# Patient Record
Sex: Male | Born: 1955
Health system: Southern US, Community
[De-identification: ages and names within clinical notes are randomized; demographics above are authoritative.]

## PROBLEM LIST (undated history)

## (undated) DIAGNOSIS — R0602 Shortness of breath: Secondary | ICD-10-CM

## (undated) DIAGNOSIS — D649 Anemia, unspecified: Secondary | ICD-10-CM

## (undated) DIAGNOSIS — K219 Gastro-esophageal reflux disease without esophagitis: Secondary | ICD-10-CM

## (undated) DIAGNOSIS — I10 Essential (primary) hypertension: Secondary | ICD-10-CM

## (undated) DIAGNOSIS — N189 Chronic kidney disease, unspecified: Secondary | ICD-10-CM

## (undated) HISTORY — PX: HEMORRHOID SURGERY: SHX153

## (undated) HISTORY — PX: APPENDECTOMY: SHX54

---

## 2000-07-10 ENCOUNTER — Ambulatory Visit (HOSPITAL_COMMUNITY): Admission: RE | Admit: 2000-07-10 | Discharge: 2000-07-10 | Payer: Self-pay | Admitting: Pulmonary Disease

## 2003-02-16 ENCOUNTER — Ambulatory Visit (HOSPITAL_COMMUNITY): Admission: RE | Admit: 2003-02-16 | Discharge: 2003-02-16 | Payer: Self-pay | Admitting: Internal Medicine

## 2003-02-16 IMAGING — CT CT ABDOMEN W/ CM
1 of 3 series · 13 of 32 positions shown, 18 images · IV contrast (agent unspecified)
Comparison: none

**THIS REPORT HAS BEEN UPDATED TO INCLUDE ALL ASSOCIATED EXAMS**
CLINICAL DATA: Left lower quadrant abdominal pain, low back pain and constipation.  
Comparisons:  None.
CT ABDOMEN WITH CONTRAST
Contiguous 5 mm axial images were obtained through the abdomen and pelvis after oral and 100 cc non-ionic intravenous contrast.  
Subcentimeter well defined low density lesion is identified in the central liver but the liver and spleen are otherwise unremarkable.  Stomach, duodenum, pancreas, gallbladder, and abdominal bowel loops are unremarkable.  There is stool scattered along the course of the colon, but it is not distended.  The left adrenal gland is diffusely thickened.  2.2 cm mass is identified in the right adrenal gland which measures 47 Hounsfield units on the portal venous phase study and 16 Hounsfield units on the renal delay images.  There are subcentimeter well defined low density lesions within the parenchyma of each kidney.  There is no free fluid or adenopathy.  No abdominal aortic aneurysm.  
The proximal jejunum, beginning at the level of the ligament of Treitz is mildly dilated, but there is no substantial wall thickening in this region.  There is no peri-enteric inflammation at the level of the proximal jejunum.  
IMPRESSION
1.  Tiny low density lesions seen in the liver and kidneys are too small to characterize but statistically are most likely cysts.
2.  2.7 cm right adrenal lesions washes out by greater than 50% on delayed images, consistent with benign adrenal adenoma.
CT PELVIS WITH CONTRAST
There is no free fluid or lymphadenopathy.  Prostate gland is mildly prominent.  The bladder is distended but unremarkable.  Stool is noted in the sigmoid colon, but there is no associated pericolonic inflammation.  An area of apparent wall thickening is seen in the ascending colon, but this is at the level of a fold between the more horizontally oriented cecum and the vertically oriented ascending colon.
No definable etiology to explain this patient?s history of left lower quadrant pain.

[Series 4367: — · axial · 0.67mm/px · z∈[+1358,+1758]mm · 13 of 92 slices shown, 18 images]
[im 6/92  soft-tissue]
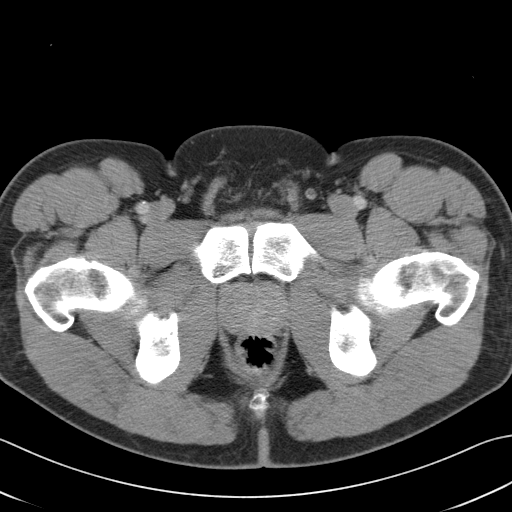
[im 6/92  bone]
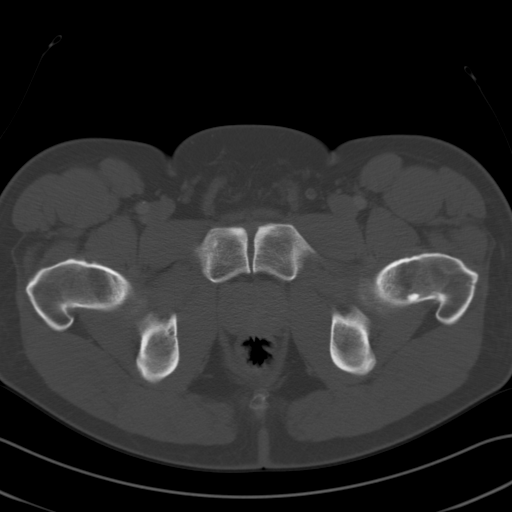
[im 12/92  soft-tissue]
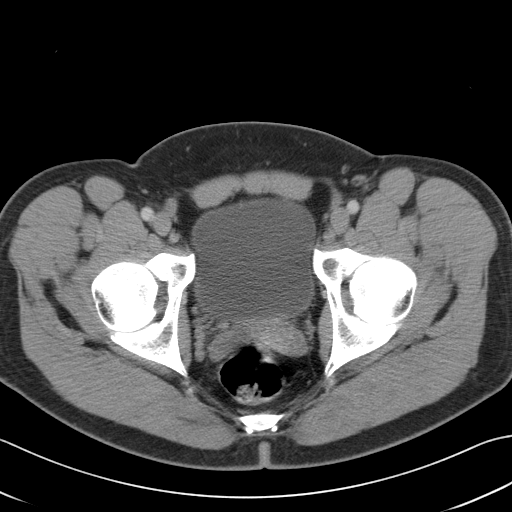
[im 23/92  soft-tissue]
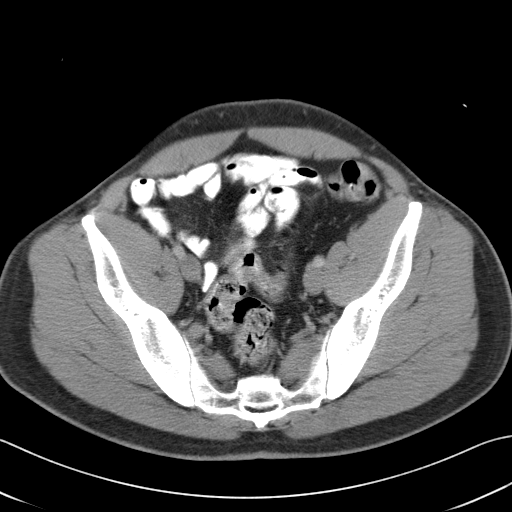
[im 29/92  soft-tissue]
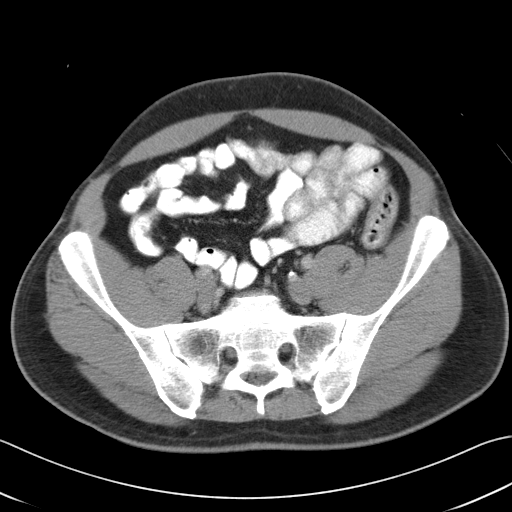
[im 35/92  soft-tissue]
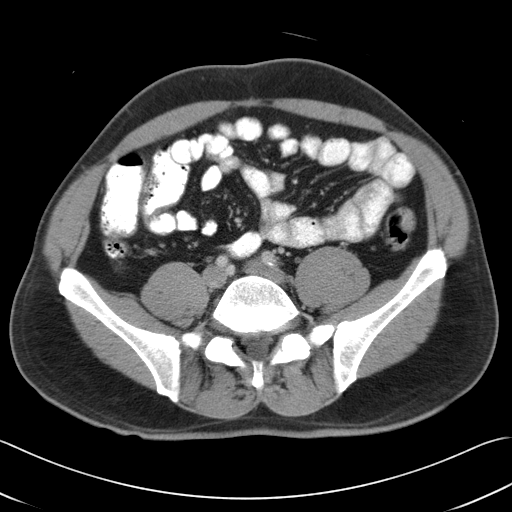
[im 40/92  soft-tissue]
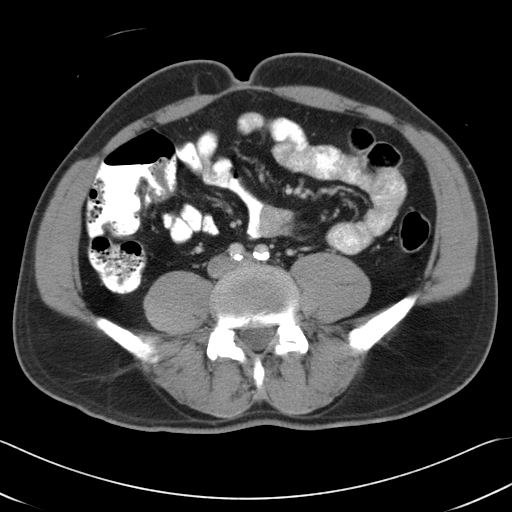
[im 52/92  soft-tissue]
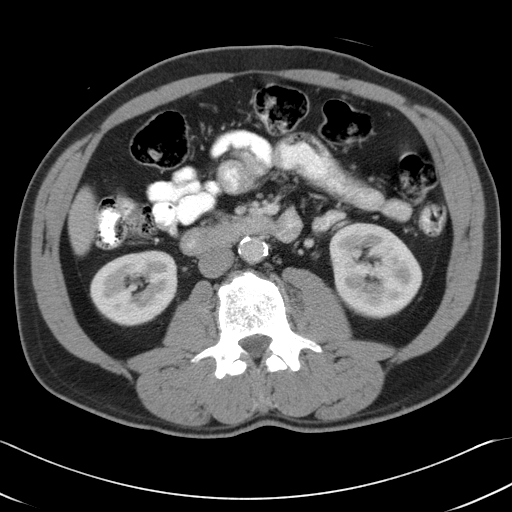
[im 57/92  soft-tissue]
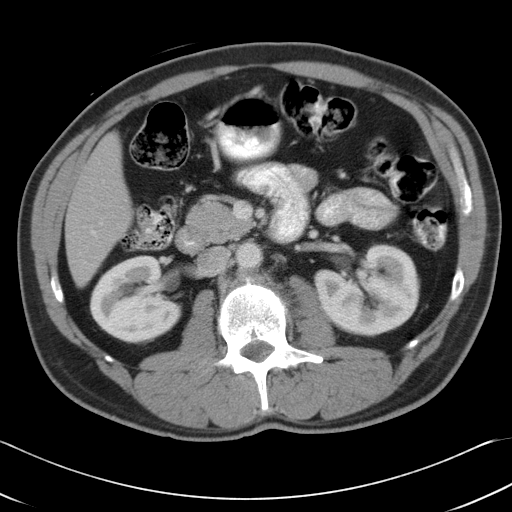
[im 63/92  soft-tissue]
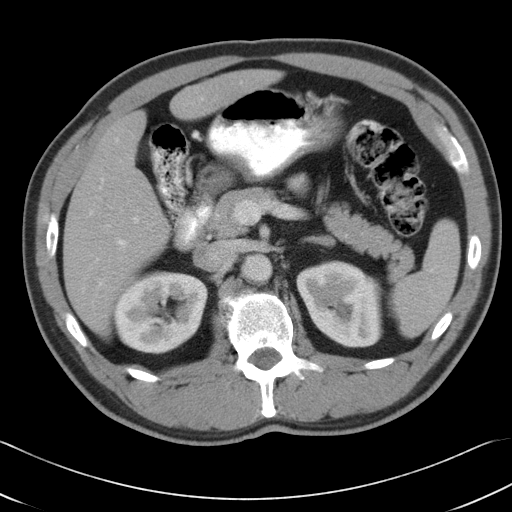
[im 63/92  bone]
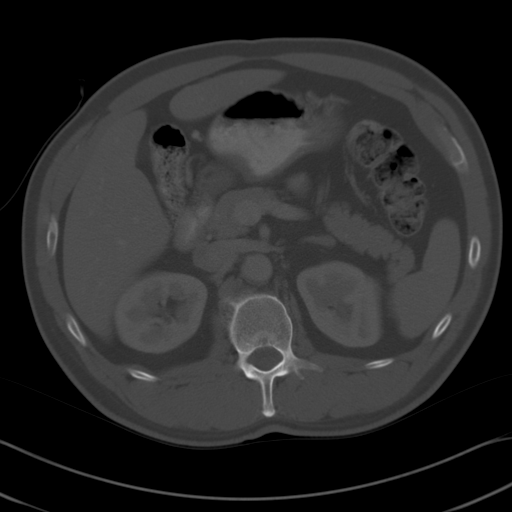
[im 69/92  soft-tissue]
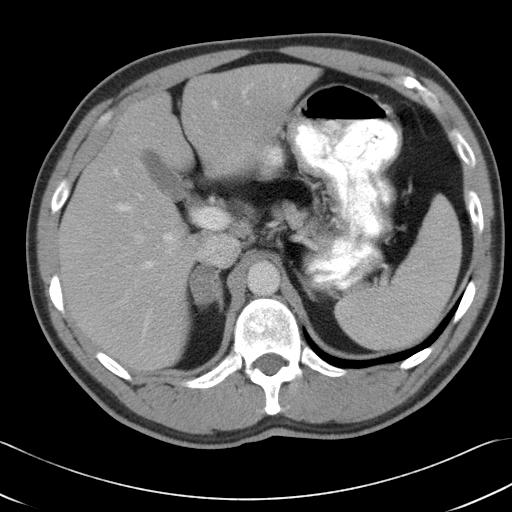
[im 69/92  lung]
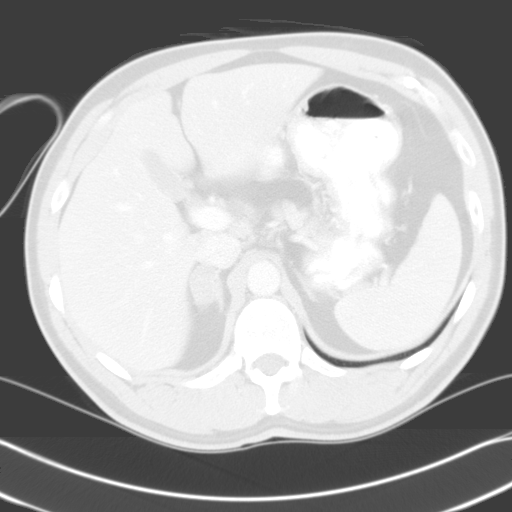
[im 74/92  lung]
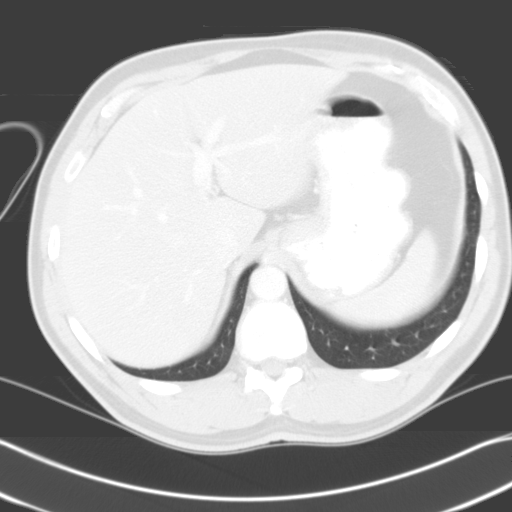
[im 80/92  soft-tissue]
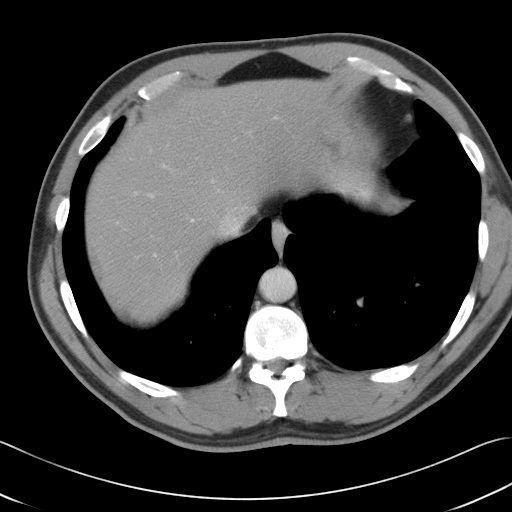
[im 80/92  lung]
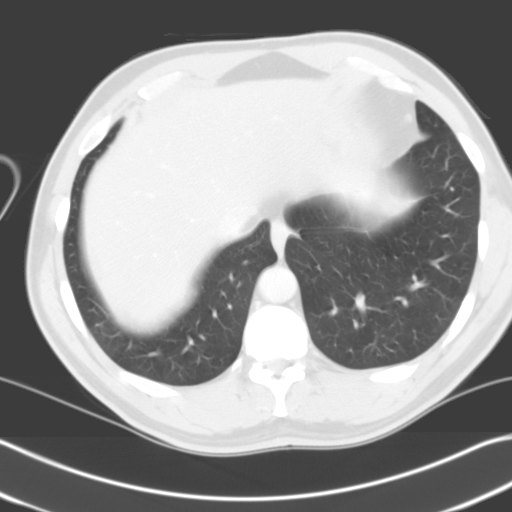
[im 86/92  soft-tissue]
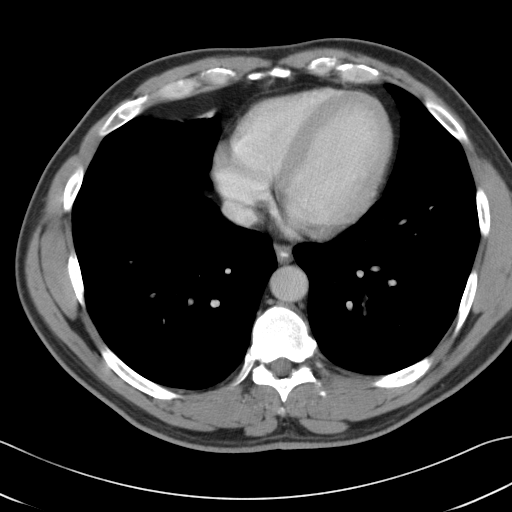
[im 86/92  lung]
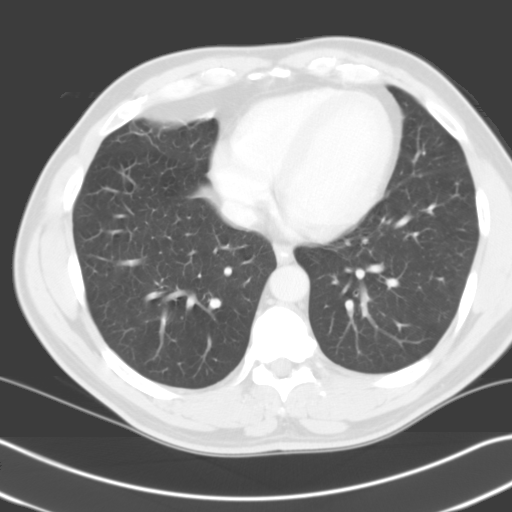

[13 of 32 positions shown; findings below may reference images not displayed]

## 2003-05-10 ENCOUNTER — Ambulatory Visit (HOSPITAL_COMMUNITY): Admission: RE | Admit: 2003-05-10 | Discharge: 2003-05-10 | Payer: Self-pay | Admitting: Internal Medicine

## 2004-03-16 ENCOUNTER — Ambulatory Visit: Payer: Self-pay | Admitting: Orthopedic Surgery

## 2004-03-17 ENCOUNTER — Ambulatory Visit: Payer: Self-pay | Admitting: Orthopedic Surgery

## 2004-03-17 ENCOUNTER — Ambulatory Visit (HOSPITAL_COMMUNITY): Admission: RE | Admit: 2004-03-17 | Discharge: 2004-03-17 | Payer: Self-pay | Admitting: Orthopedic Surgery

## 2004-03-20 ENCOUNTER — Ambulatory Visit: Payer: Self-pay | Admitting: Orthopedic Surgery

## 2004-03-27 ENCOUNTER — Ambulatory Visit: Payer: Self-pay | Admitting: Orthopedic Surgery

## 2005-11-29 ENCOUNTER — Emergency Department (HOSPITAL_COMMUNITY): Admission: EM | Admit: 2005-11-29 | Discharge: 2005-11-30 | Payer: Self-pay | Admitting: Emergency Medicine

## 2005-11-29 IMAGING — CR DG CHEST 1V PORT
1 series · 1 of 1 positions shown · non-contrast
Comparison: None available.

CLINICAL DATA: 49-year-old with chest pain.  
 PORTABLE CHEST - 1 VIEW [DATE]:

[view not recorded]
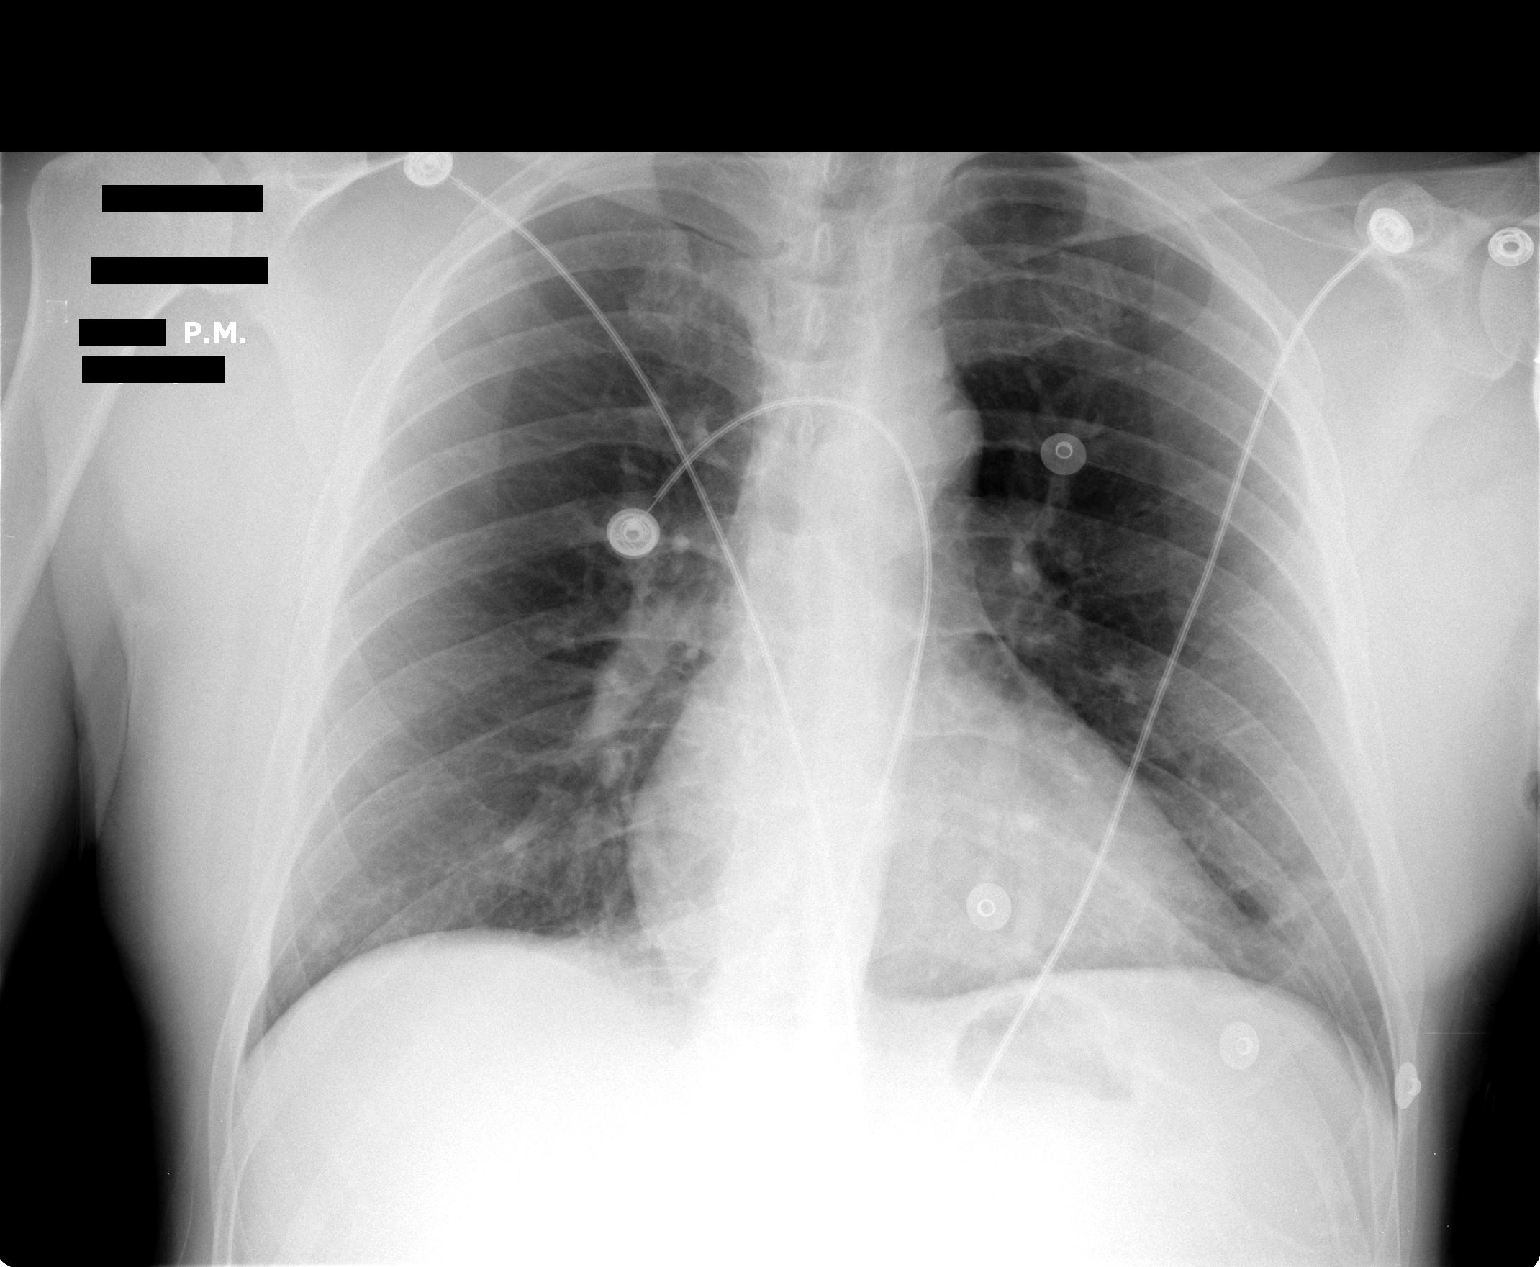

[1 of 1 positions shown; findings below may reference images not displayed]

FINDINGS: Cardiac silhouette, mediastinal and hilar contours are within normal limits.  Slightly low lung volumes with vascular crowding and bibasilar atelectasis.  Rounded densities overlying both lower lung zones are likely nipple shadows.  No edema or effusions.
IMPRESSION: 1.  Low volume chest film with vascular crowding and basilar atelectasis left slightly greater than right.  No edema or effusions.  
 2.  Probable nipple shadows bilaterally.

## 2008-12-06 ENCOUNTER — Ambulatory Visit (HOSPITAL_COMMUNITY): Admission: RE | Admit: 2008-12-06 | Discharge: 2008-12-06 | Payer: Self-pay | Admitting: Family Medicine

## 2008-12-06 IMAGING — CR DG LUMBAR SPINE COMPLETE 4+V
5 series · 5 of 5 positions shown · non-contrast
Comparison: None.

CLINICAL DATA: Low back pain with left leg pain.

LUMBAR SPINE - COMPLETE 4+ VIEW

[view not recorded (1 of 5)]
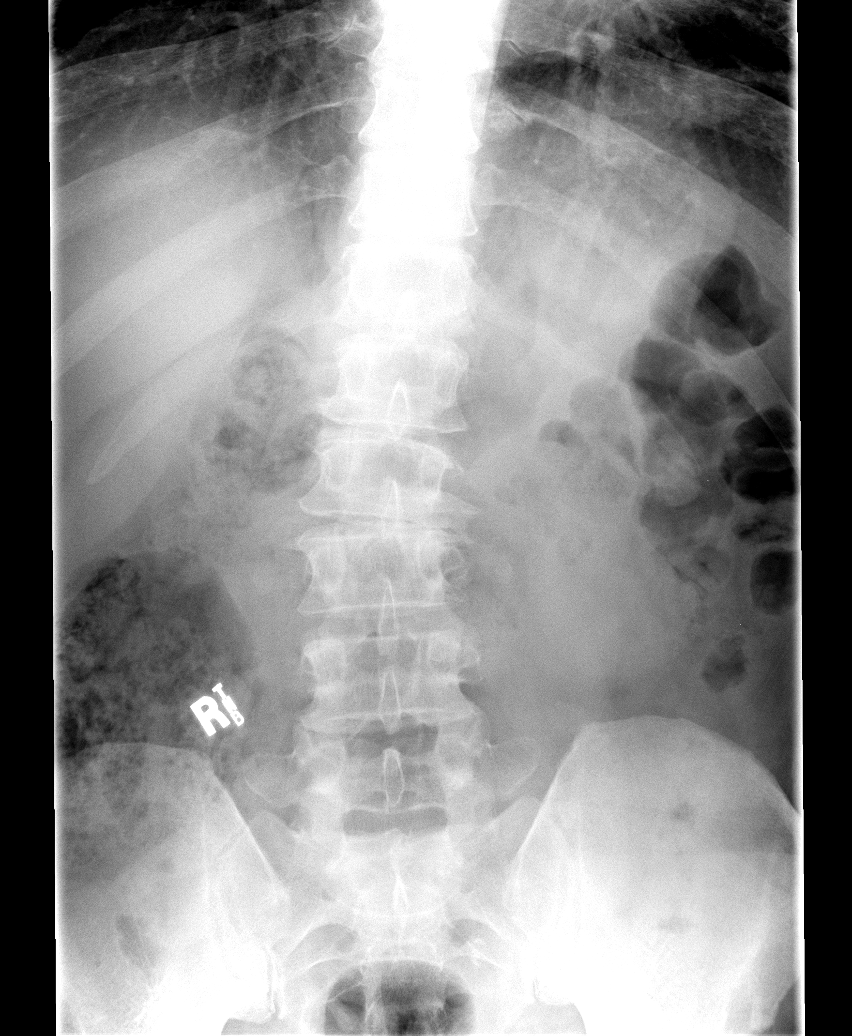

[view not recorded (2 of 5)]
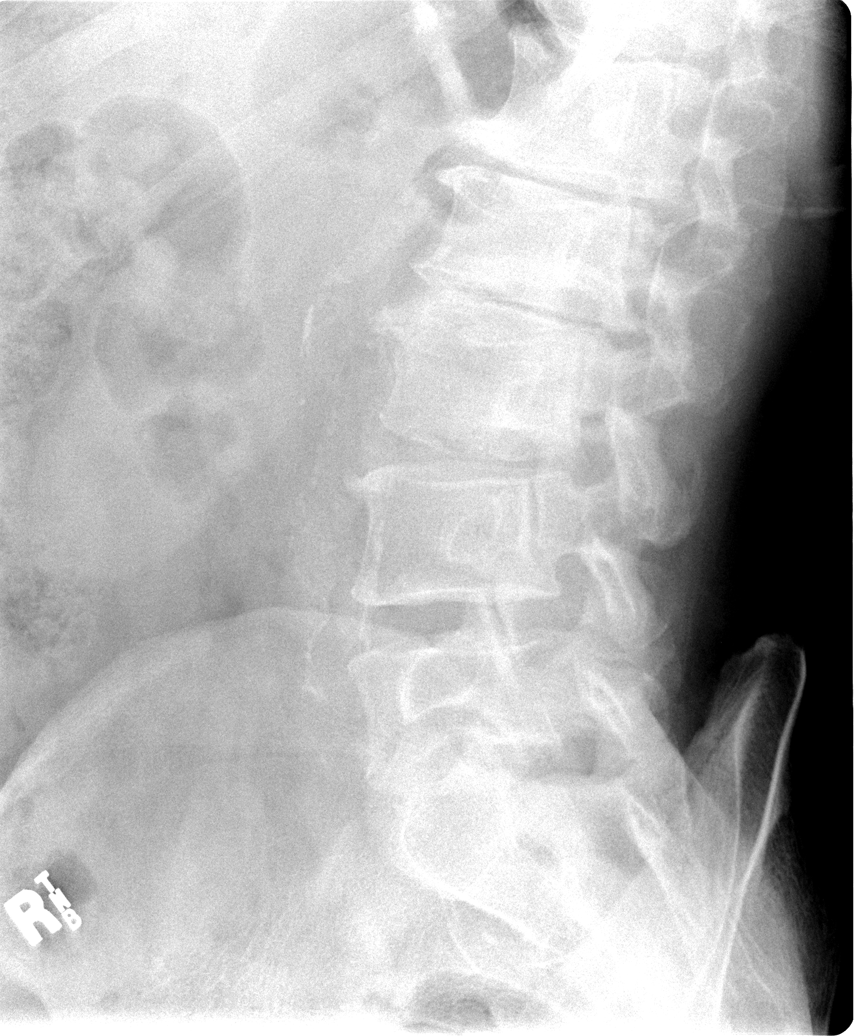

[view not recorded (3 of 5)]
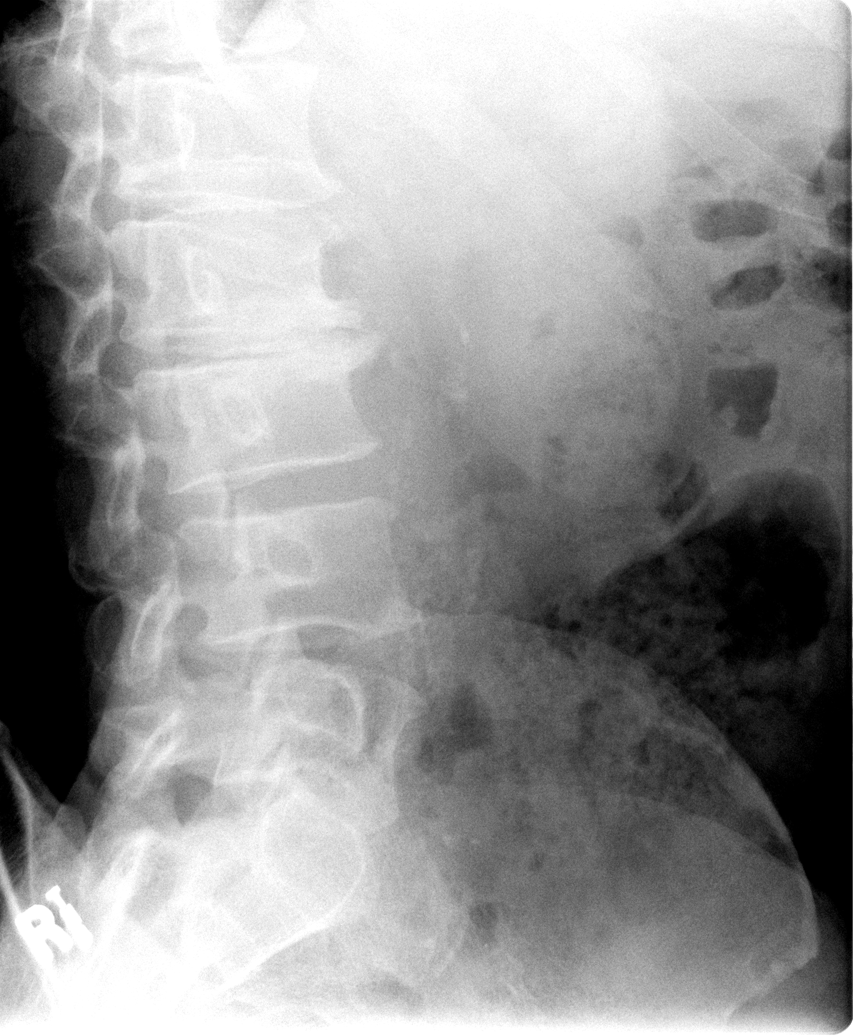

[view not recorded (4 of 5)]
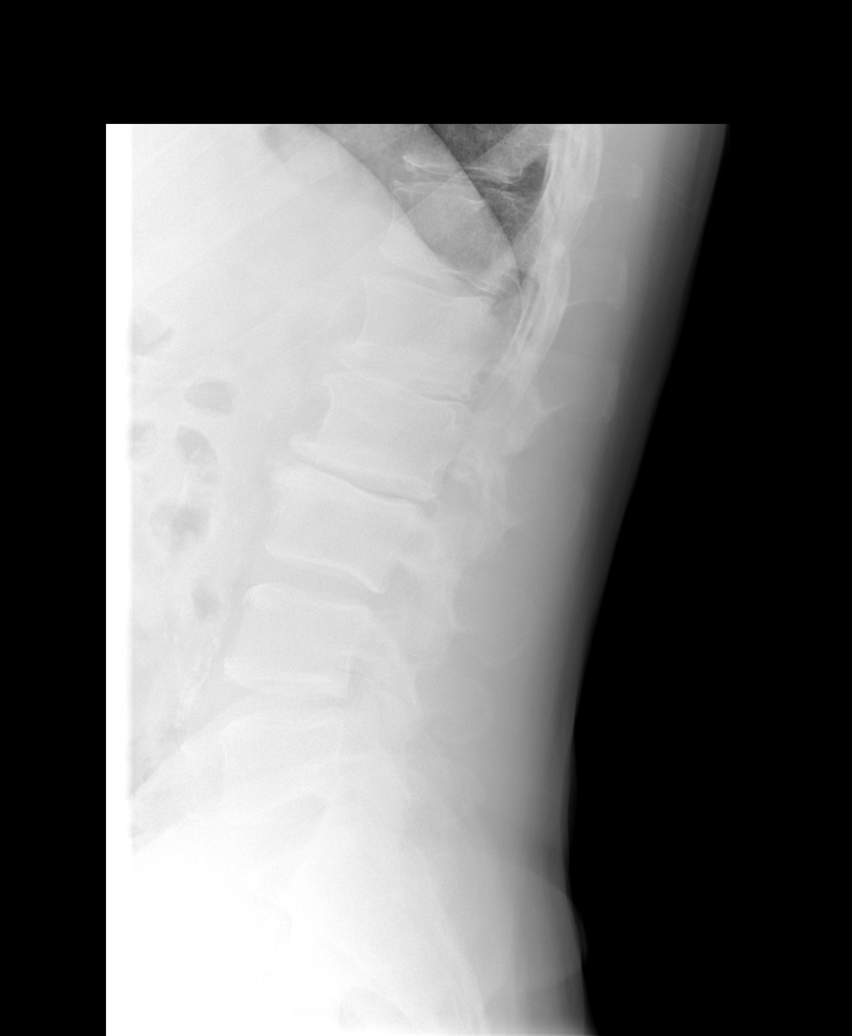

[view not recorded (5 of 5)]
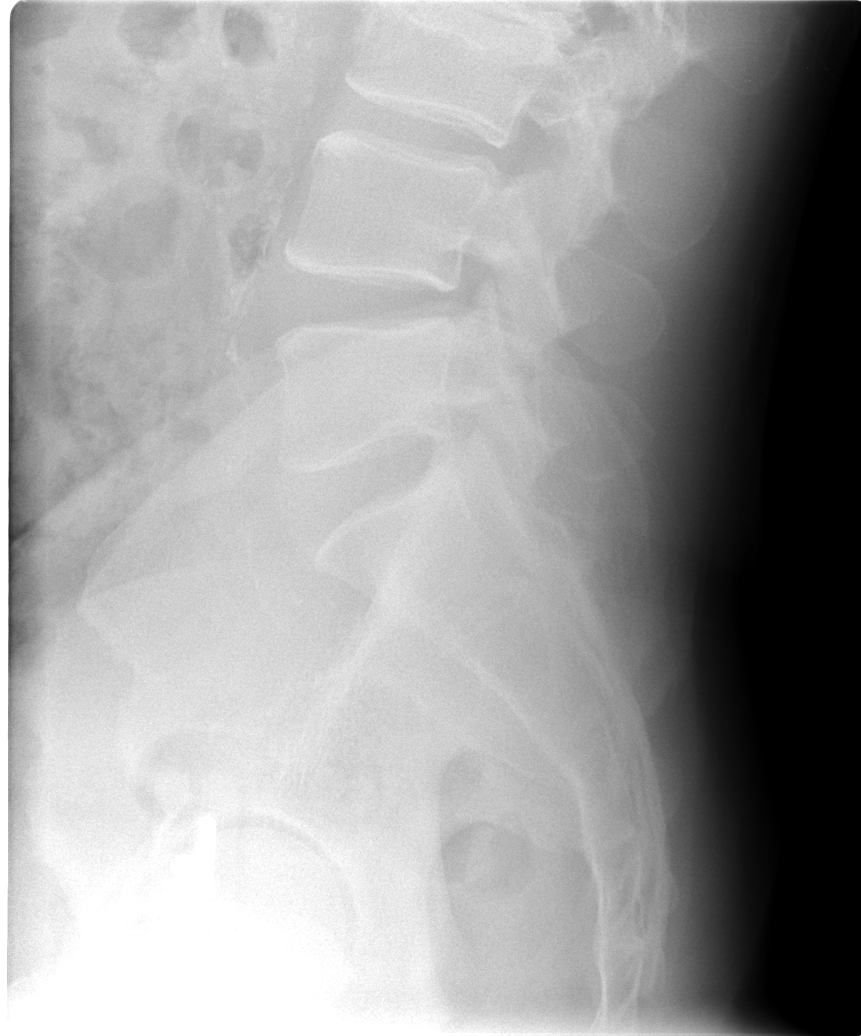

[5 of 5 positions shown; findings below may reference images not displayed]

FINDINGS: Normal lumbar alignment.  Negative for fracture or acute
bony lesion.  There is disc degeneration at L1-2 and L2-3 of a
moderate degree.  Remaining disc spaces appear normal.  There is no
pars defect.  There is mild dextroscoliosis at L2-3.  There is mild
atherosclerotic calcification in the aorta.
IMPRESSION: Moderate disc degeneration at L1-2 and L2-3.  No acute bony
abnormality.

## 2010-06-02 NOTE — Op Note (Signed)
NAMEKLAYTON, Gerald Taylor NO.:  [id number]   MEDICAL RECORD NO.:  [id number]          PATIENT TYPE:  AMB   LOCATION:  DAY                           FACILITY:  [facility]   PHYSICIAN:  Vickki Hearing, M.D.DATE OF BIRTH:  Jul 29, 1955   DATE OF PROCEDURE:  03/17/2004  DATE OF DISCHARGE:                                 OPERATIVE REPORT   PREOPERATIVE DIAGNOSIS:  Volar wrist ganglion, right upper extremity.   POSTOPERATIVE DIAGNOSIS:  Volar wrist ganglion, right upper extremity.   PROCEDURE:  Excision of mass, right wrist.   OPERATIVE FINDINGS:  Volar wrist ganglion multilobulated, appeared to be  tracking from the radiocarpal joint. No other lesions were seen.   SURGEON:  Dr. Romeo Apple.   ASSISTANT:  Victoriano Lain.   ANESTHETIC:  Bier block.   DETAILS OF PROCEDURE:  First, Darrian Grzelak was identified in preop holding  area. His right wrist was marked as surgical site by the patient and  physician. His history and physical was updated, and he was given Ancef. He  was taken to the operating room for a Bier block. Once this was  satisfactory, the right upper extremity was prepped and draped using a  sterile technique. We took a time-out as required, and the following things  were confirmed:  The patient was [name], his right wrist was the  surgical site, he was have removal of a mass, his antibiotics had been given  and all equipment was in the room.   Using a transverse incision and Langer's lines, the mass was excised. The  mass was multilobulated tracked superficially and then deep radially towards  the radiocarpal joint. We irrigated the wound, coagulated small veins, and  closed with 2-0 Monocryl and Steri-Strips. We injected 10 cc of plain  Sensorcaine 1/2% and applied a sterile dressing. We released the tourniquet,  checked the fingers, they were pink and viable and the patient was taken to  the recovery room in stable condition.      SEH/MEDQ  D:   03/17/2004  T:  03/17/2004  Job:  [id number]

## 2010-06-02 NOTE — Op Note (Signed)
NAME:  Gerald Taylor, Gerald Taylor                          ACCOUNT NO.:  [id number]   MEDICAL RECORD NO.:  [id number]                   PATIENT TYPE:  AMB   LOCATION:  DAY                                  FACILITY:  [facility]   PHYSICIAN:  R. Roetta Sessions, M.D.              DATE OF BIRTH:  07/06/1955   DATE OF PROCEDURE:  05/10/2003  DATE OF DISCHARGE:                                 OPERATIVE REPORT   PROCEDURE:  Esophagogastroduodenoscopy with biopsy followed by diagnostic  colonoscopy.   ENDOSCOPIST:  Gerrit Friends. Rourk, M.D.   INDICATIONS FOR PROCEDURE:  The patient is a 55 year old gentleman with  longstanding gastroesophageal reflux disease and intermittent blood per  rectum.  EGD and colonoscopy are now being done.  This approach has been  discussed with the patient previously and, again, at the bedside.  Please  see my dictated office note for more information.   PROCEDURE NOTE:  O2 saturation, blood pressure, pulse and respirations were  monitored throughout the entirety of the procedures.  Conscious sedation:  Versed 4.5 mg IV, Demerol 100 mg IV.   INSTRUMENT:  Olympus video chip system.   FINDINGS:  Examination of the tubular esophagus revealed a 2 cm tongue of  salmon-colored epithelium in the distal esophagus.  There were associated  erosions. No ring, stricture, or evidence of neoplasm.   STOMACH:  The gastric cavity was empty.  It insufflated well with air.  A  thorough examination of the gastric mucosa including a retroflex view of the  proximal stomach and esophagogastric junction demonstrated only a small  hiatal hernia.  The pylorus was patent and easily traversed.  Examination of  the bulb and second portion revealed a couple of bulbar erosions, otherwise  D1 and D2 appeared normal.   COLONOSCOPY FINDINGS:  A digital exam revealed no abnormalities.   ENDOSCOPIC FINDINGS:  The prep was adequate.   RECTUM:  Examination of the rectal mucosa including the retroflex view of  the anal verge revealed only anal canal hemorrhoids.   COLON:  The colonic mucosa was surveyed from the rectosigmoid junction  through the left transverse and right colon to the area of the appendiceal  orifice, ileocecal valve, and cecum.  These structures were well seen and  photographed for the record.   From this level the scope was slowly withdrawn.  All previously mentioned  mucosal surfaces were, once again, seen.  The colonic mucosa appeared  normal.  The patient tolerated both procedures well and was reacted in  endoscopy.   EGD IMPRESSION:  1. Salmon colored epithelium distal esophagus extending for at least a short     segment of Barrett's esophagus biopsied.  2. Distal esophageal erosions consistent with mild erosive reflux     esophagitis, otherwise normal esophagus.  3. Small hiatal hernia, otherwise normal stomach.  4. A couple of bulbar erosions, otherwise D1 and D2 appeared normal.   COLONOSCOPY  FINDINGS:  1. Anal canal hemorrhoids otherwise normal rectum.  2. Normal colon.   DISCUSSION:  I suspect that the patient has bleed from hemorrhoids  intermittently.   RECOMMENDATIONS:  1. Antireflux literature provided to Mr. Xiong.  2. Hemorrhoid literature provided to Mr. Buttram.  3. A 10-day course of Anusol HC suppositories, 1 per rectum at bedtime.  4. Begin Protonix 40 mg orally daily.  5. Follow up on path.  6. Further recommendations to follow.      ___________________________________________                                            Jonathon Bellows, M.D.   RMR/MEDQ  D:  05/10/2003  T:  05/10/2003  Job:  [id number]   cc:   R. Roetta Sessions, M.D.  [address]  [place]  [place] [address]  Fax: [phone]

## 2010-09-20 ENCOUNTER — Ambulatory Visit (HOSPITAL_COMMUNITY)
Admission: RE | Admit: 2010-09-20 | Discharge: 2010-09-20 | Disposition: A | Discharge status: Home / Self Care | Payer: BC Managed Care – PPO | Source: Ambulatory Visit | Attending: Family Medicine | Admitting: Family Medicine

## 2010-09-20 ENCOUNTER — Other Ambulatory Visit (HOSPITAL_COMMUNITY): Payer: Self-pay | Admitting: Family Medicine

## 2010-09-20 DIAGNOSIS — R0602 Shortness of breath: Secondary | ICD-10-CM

## 2010-09-20 DIAGNOSIS — Z0000 Encounter for general adult medical examination without abnormal findings: Secondary | ICD-10-CM

## 2010-09-20 IMAGING — CR DG CHEST 2V
2 series · 2 of 2 positions shown · non-contrast
Comparison: Single view of the chest [DATE].

CLINICAL DATA: Shortness of breath.

CHEST - 2 VIEW

[view not recorded (1 of 2)]
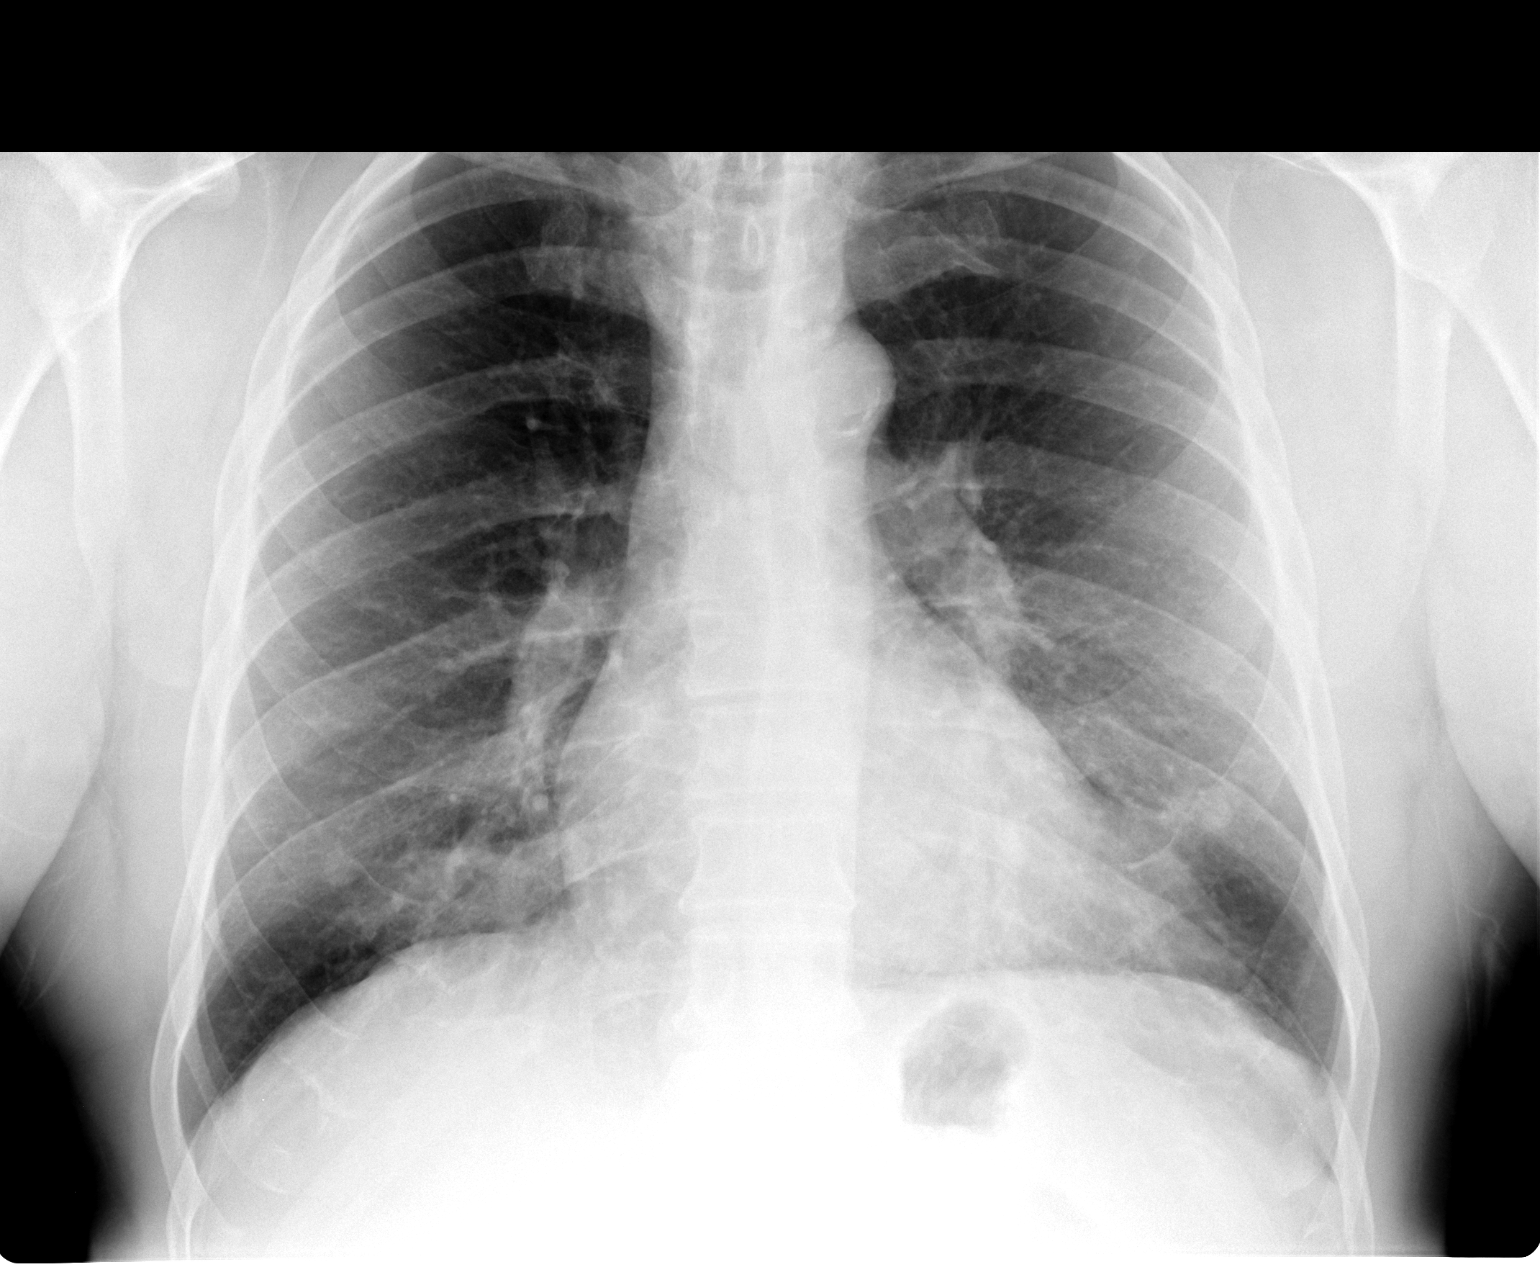

[view not recorded (2 of 2)]
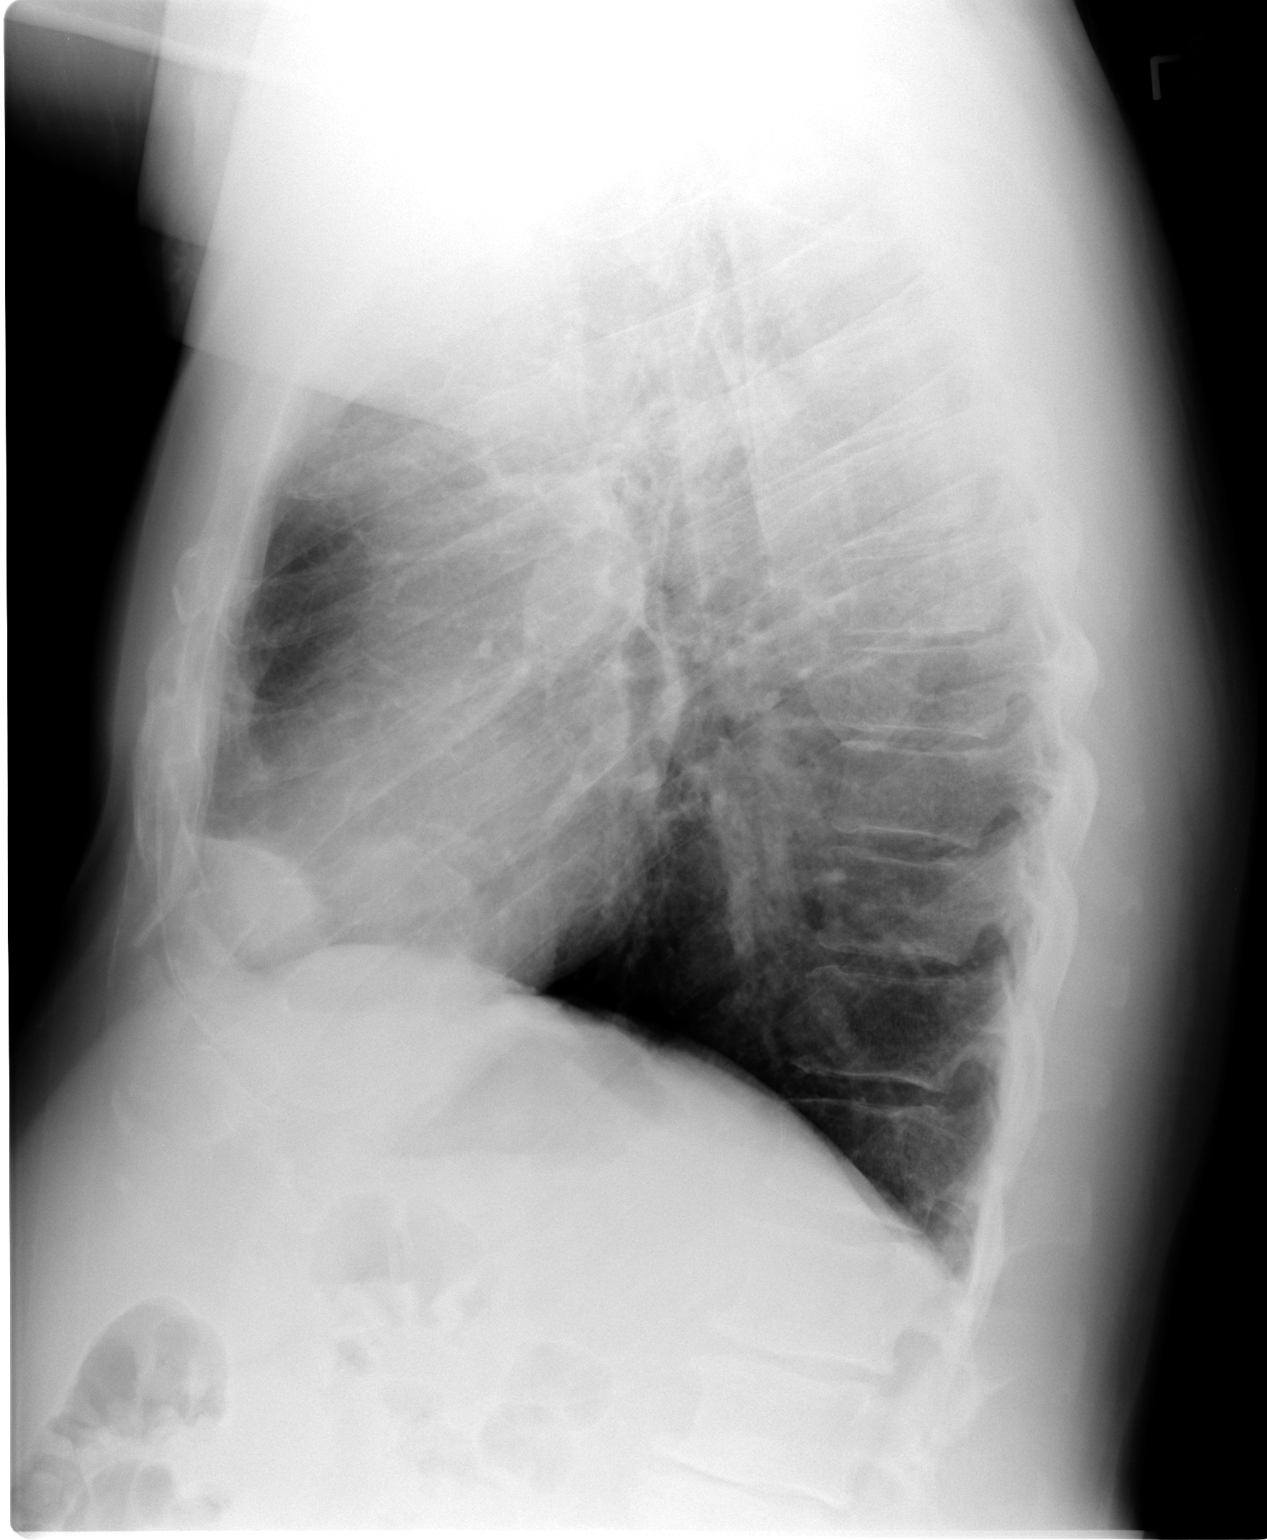

[2 of 2 positions shown; findings below may reference images not displayed]

FINDINGS: Bilateral nipple shadows are again seen.  The lungs are
clear.  No pneumothorax or effusion.  Heart size normal.
IMPRESSION: No acute disease.

## 2011-01-12 ENCOUNTER — Other Ambulatory Visit: Payer: Self-pay

## 2011-01-12 ENCOUNTER — Emergency Department (HOSPITAL_COMMUNITY): Payer: BC Managed Care – PPO

## 2011-01-12 ENCOUNTER — Emergency Department (HOSPITAL_COMMUNITY)
Admission: EM | Admit: 2011-01-12 | Discharge: 2011-01-13 | Discharge status: Against Medical Advice | Payer: BC Managed Care – PPO | Attending: Internal Medicine | Admitting: Internal Medicine

## 2011-01-12 ENCOUNTER — Encounter: Payer: Self-pay | Admitting: *Deleted

## 2011-01-12 DIAGNOSIS — R079 Chest pain, unspecified: Secondary | ICD-10-CM | POA: Insufficient documentation

## 2011-01-12 DIAGNOSIS — Z7982 Long term (current) use of aspirin: Secondary | ICD-10-CM | POA: Insufficient documentation

## 2011-01-12 DIAGNOSIS — R110 Nausea: Secondary | ICD-10-CM | POA: Insufficient documentation

## 2011-01-12 DIAGNOSIS — R0602 Shortness of breath: Secondary | ICD-10-CM | POA: Insufficient documentation

## 2011-01-12 DIAGNOSIS — I10 Essential (primary) hypertension: Secondary | ICD-10-CM | POA: Insufficient documentation

## 2011-01-12 DIAGNOSIS — Z79899 Other long term (current) drug therapy: Secondary | ICD-10-CM | POA: Insufficient documentation

## 2011-01-12 DIAGNOSIS — M542 Cervicalgia: Secondary | ICD-10-CM | POA: Insufficient documentation

## 2011-01-12 DIAGNOSIS — M79609 Pain in unspecified limb: Secondary | ICD-10-CM | POA: Insufficient documentation

## 2011-01-12 HISTORY — DX: Essential (primary) hypertension: I10

## 2011-01-12 LAB — POCT I-STAT, CHEM 8
Creatinine, Ser: 0.7 mg/dL (ref 0.50–1.35)
Hemoglobin: 15.3 g/dL (ref 13.0–17.0)
Potassium: 3.9 mEq/L (ref 3.5–5.1)
Sodium: 141 mEq/L (ref 135–145)

## 2011-01-12 LAB — CBC
Hemoglobin: 15.2 g/dL (ref 13.0–17.0)
MCHC: 34.1 g/dL (ref 30.0–36.0)
RBC: 4.78 MIL/uL (ref 4.22–5.81)
WBC: 11.1 10*3/uL — ABNORMAL HIGH (ref 4.0–10.5)

## 2011-01-12 LAB — POCT I-STAT TROPONIN I

## 2011-01-12 IMAGING — CR DG CHEST 2V
2 series · 2 of 2 positions shown · non-contrast
Comparison: [DATE]

CLINICAL DATA: Chest pain radiating down the left arm.  Shortness
of breath.  Smoker.

CHEST - 2 VIEW

[w chest pa]
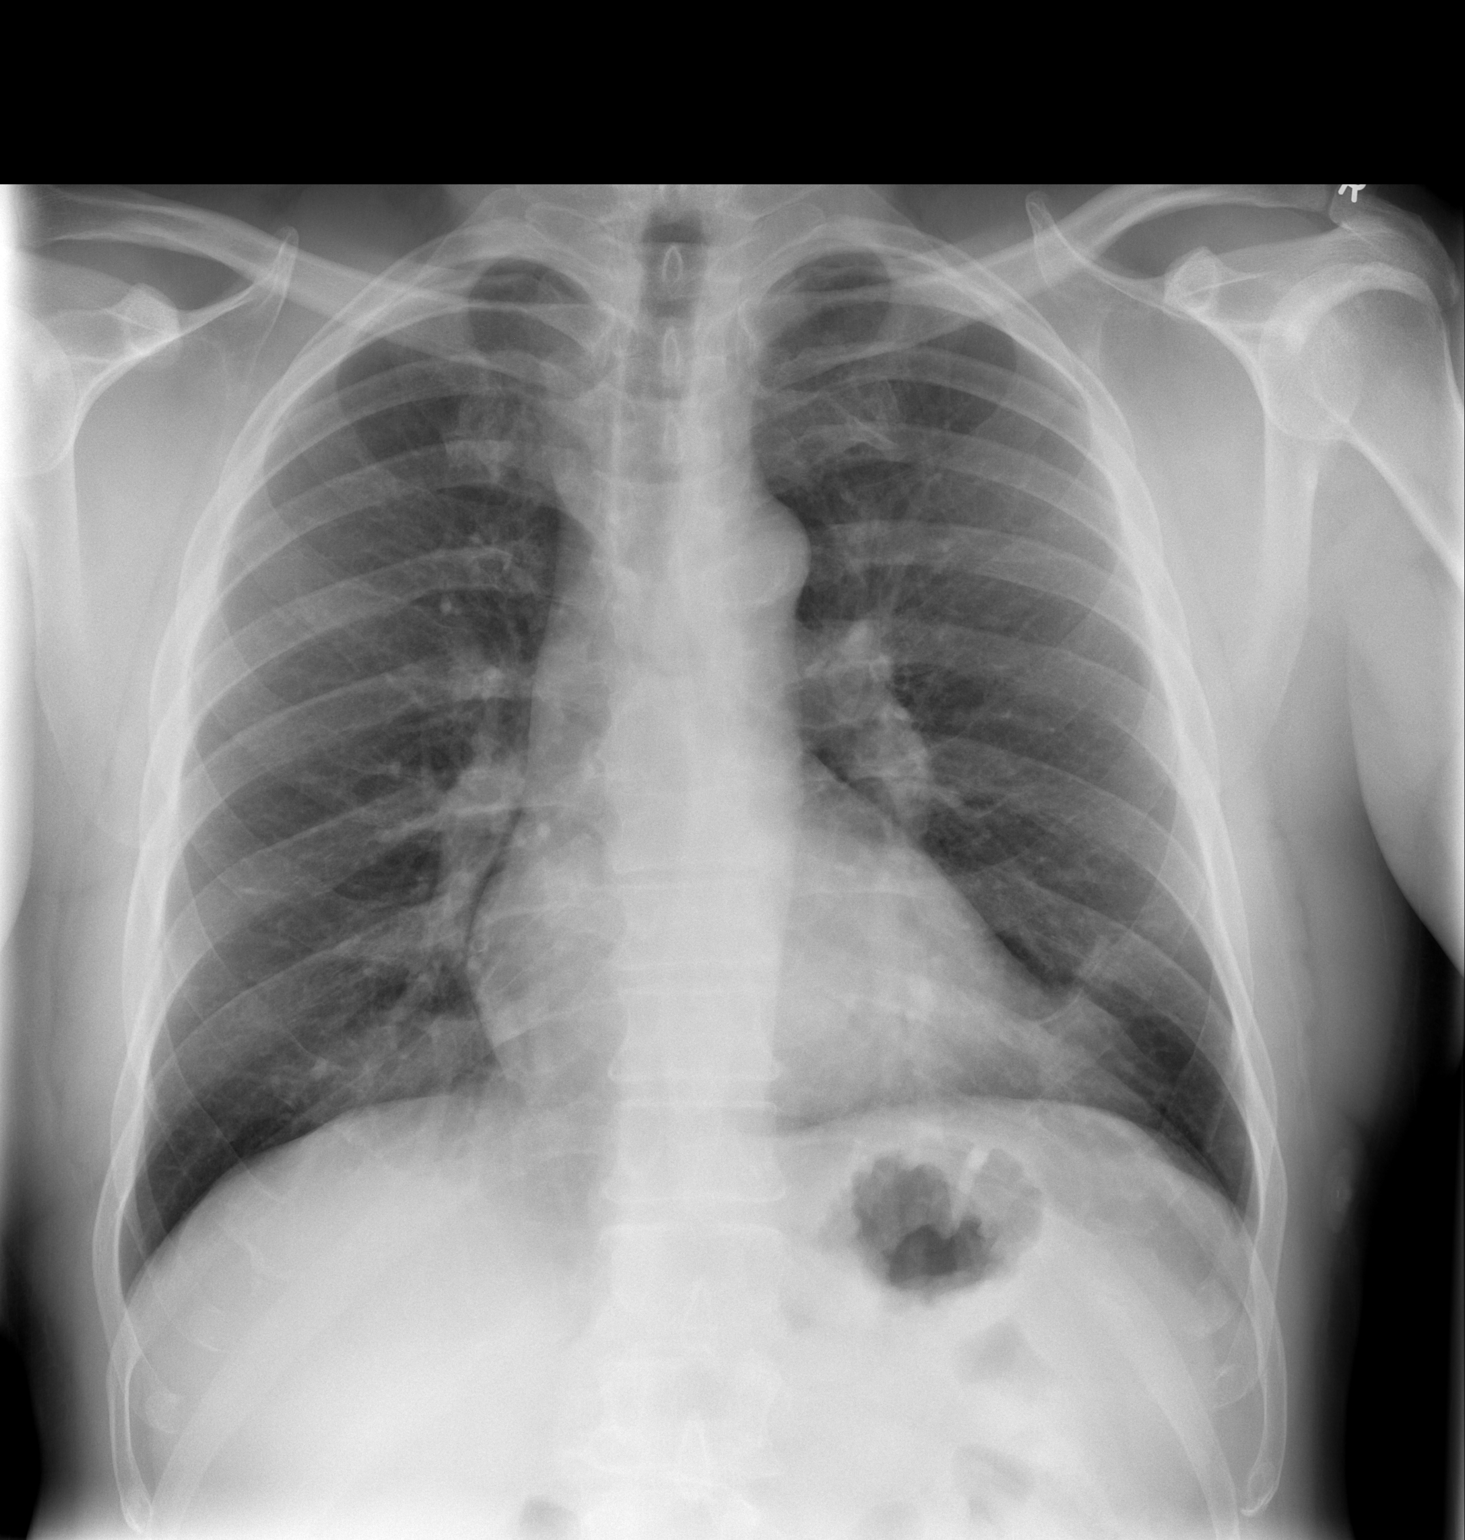

[w chest lat]
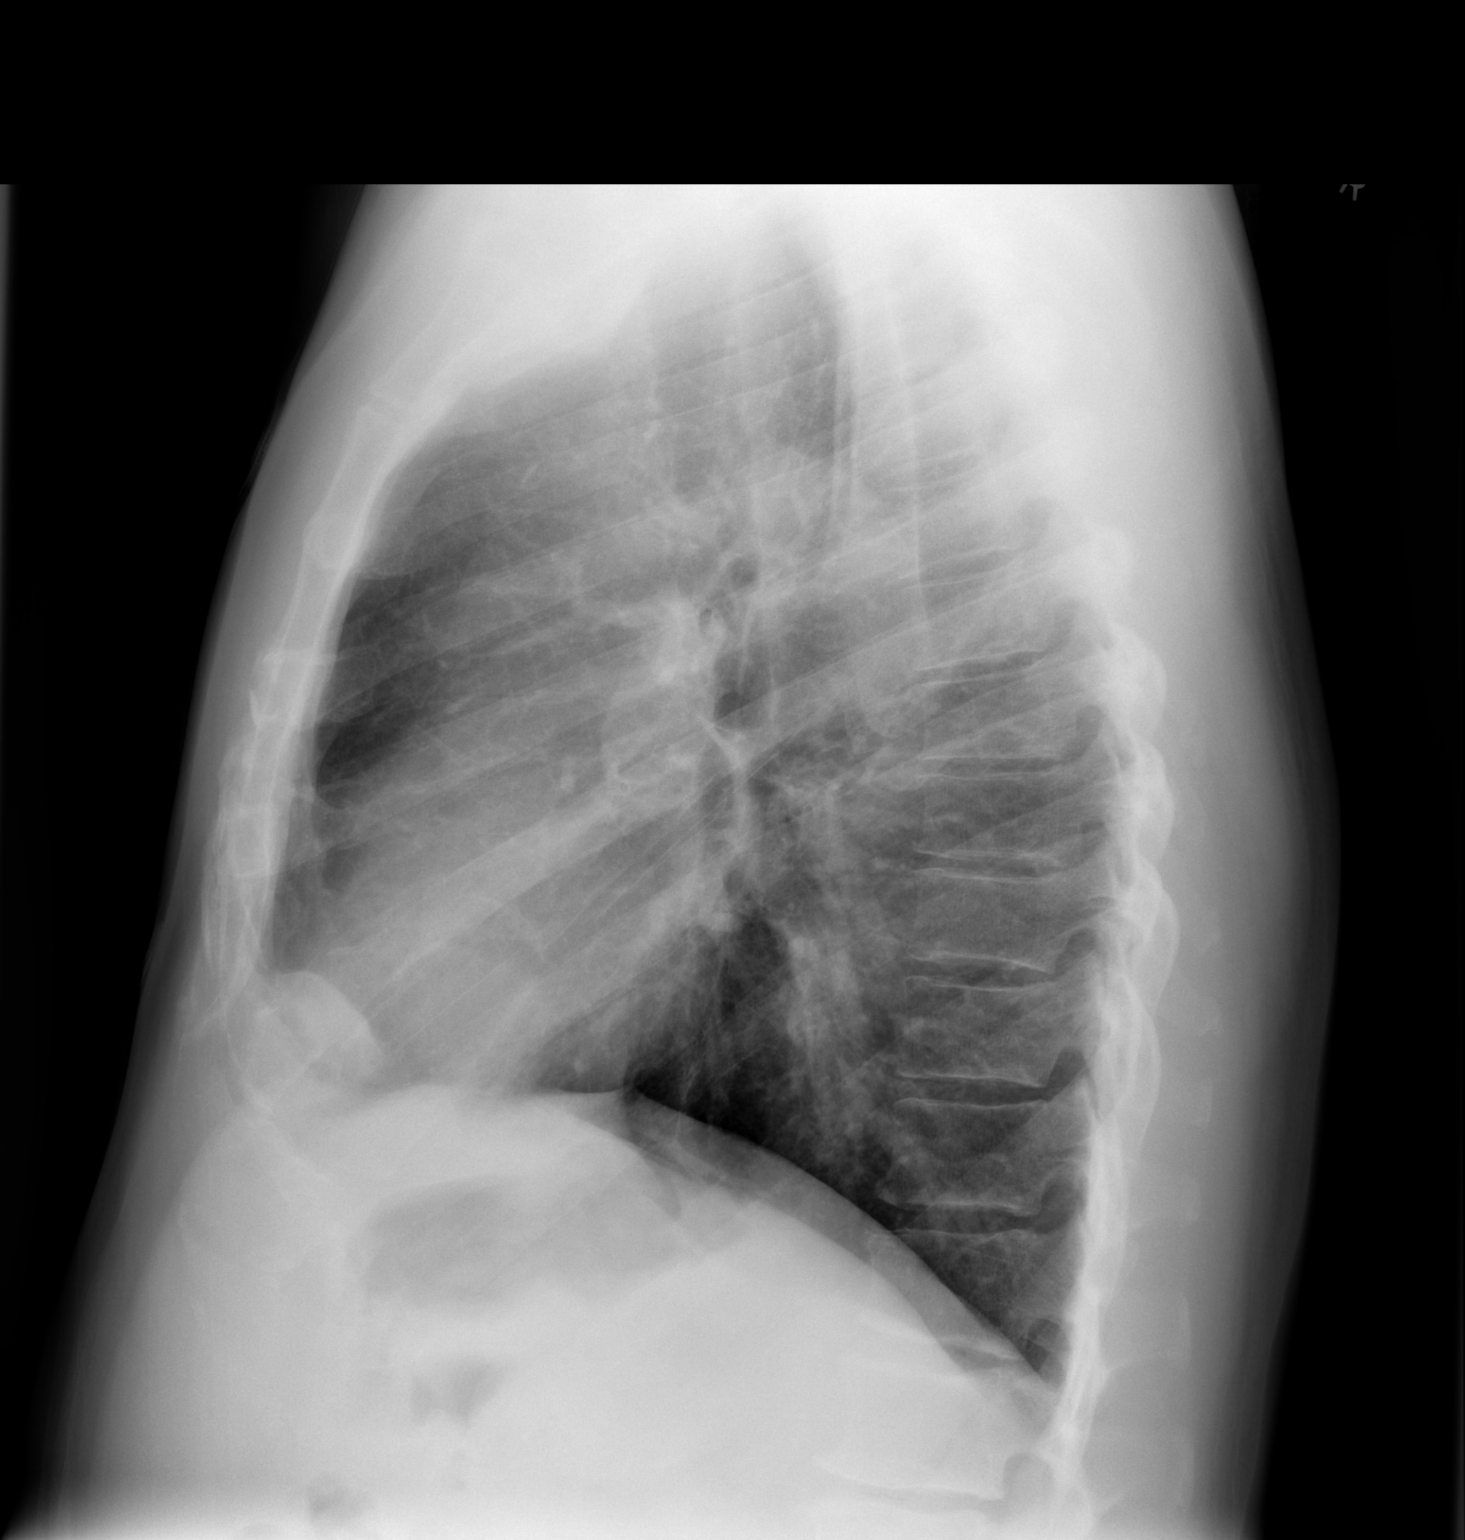

[2 of 2 positions shown; findings below may reference images not displayed]

FINDINGS: Normal heart size and pulmonary vascularity.  Mild
emphysematous changes with scattered fibrosis and scattered
calcified granulomas in the lungs.  No focal airspace
consolidation.  No blunting of costophrenic angles.  No
pneumothorax.  Mild degenerative changes in the spine.
Calcification of the aorta.  Stable appearance since previous
study.
IMPRESSION: No evidence of active pulmonary disease.

## 2011-01-12 MED ORDER — ASPIRIN 81 MG PO CHEW
162.0000 mg | CHEWABLE_TABLET | Freq: Once | ORAL | Status: AC
  Administered 2011-01-12: 162 mg via ORAL
  Filled 2011-01-12: qty 2

## 2011-01-12 NOTE — ED Provider Notes (Signed)
History     CSN: [id number]  Arrival date & time 01/12/11  2302   First MD Initiated Contact with Patient 01/12/11 2316      Chief Complaint  Patient presents with  . Chest Pain    (Consider location/radiation/quality/duration/timing/severity/associated sxs/prior treatment) Patient is a 55 y.o. male presenting with chest pain. The history is provided by the patient. No language interpreter was used.  Chest Pain The chest pain began 2 days ago. Chest pain occurs intermittently. The chest pain is unchanged. Associated with: nothing. The pain is currently at 0/10. The quality of the pain is described as tightness. Radiates to: Pain is mostly in the LUE and achy and is at rest has had some neck pain  concurrently with the arm pain.  Has had constant nausea and persiods of shrtness of breath that is new. Exacerbated by: nothing. Primary symptoms include shortness of breath and nausea. Pertinent negatives for primary symptoms include no fever, no syncope, no palpitations, no abdominal pain, no vomiting and no altered mental status.  Pertinent negatives for associated symptoms include no diaphoresis, no lower extremity edema, no near-syncope and no numbness. He tried aspirin for the symptoms. Risk factors include male gender and smoking/tobacco exposure.  His past medical history is significant for hyperlipidemia.  Pertinent negatives for past medical history include no aneurysm.     Past Medical History  Diagnosis Date  . Hypertension     Past Surgical History  Procedure Date  . Appendectomy   . Hemorrhoid surgery     No family history on file.  History  Substance Use Topics  . Smoking status: Current Everyday Smoker -- 1.0 packs/day  . Smokeless tobacco: Not on file  . Alcohol Use: Yes      Review of Systems  Constitutional: Negative for fever and diaphoresis.  HENT: Negative for facial swelling.   Eyes: Negative for discharge.  Respiratory: Positive for shortness of  breath.   Cardiovascular: Positive for chest pain. Negative for palpitations, syncope and near-syncope.  Gastrointestinal: Positive for nausea. Negative for vomiting, abdominal pain and abdominal distention.  Genitourinary: Negative for difficulty urinating.  Musculoskeletal: Negative.  Negative for gait problem.  Skin: Negative.   Neurological: Negative for numbness.  Hematological: Negative.   Psychiatric/Behavioral: Negative.  Negative for altered mental status.    Allergies  Review of patient's allergies indicates no known allergies.  Home Medications   Current Outpatient Rx  Name Route Sig Dispense Refill  . ASPIRIN EC 81 MG PO TBEC Oral Take 81 mg by mouth at bedtime.      . OMEGA-3 FATTY ACIDS 1000 MG PO CAPS Oral Take 1 g by mouth at bedtime.      . IBUPROFEN 200 MG PO TABS Oral Take 600 mg by mouth every 6 (six) hours as needed. Muscle pain and body aches     . PANTOPRAZOLE SODIUM 40 MG PO TBEC Oral Take 40 mg by mouth once.      Marland Kitchen PHENAZOPYRIDINE HCL 100 MG PO TABS Oral Take 200 mg by mouth 3 (three) times daily as needed. Urinary pain       BP 152/92  Temp(Src) 98.2 F (36.8 C) (Oral)  Resp 16  Ht 5' 10.5" (1.791 m)  Wt 215 lb (97.523 kg)  BMI 30.41 kg/m2  SpO2 97%  Physical Exam  Nursing note and vitals reviewed. Constitutional: He is oriented to person, place, and time. He appears well-developed and well-nourished.  HENT:  Head: Normocephalic and atraumatic.  Mouth/Throat: Oropharynx  is clear and moist. No oropharyngeal exudate.  Eyes: Conjunctivae are normal. Pupils are equal, round, and reactive to light.  Neck: Normal range of motion. Neck supple.  Cardiovascular: Normal rate and regular rhythm.   Pulmonary/Chest: Effort normal and breath sounds normal. No stridor. No respiratory distress.  Abdominal: Soft. Bowel sounds are normal. There is no tenderness.  Musculoskeletal: Normal range of motion.  Neurological: He is alert and oriented to person, place,  and time.  Skin: Skin is warm and dry. He is not diaphoretic.  Psychiatric: Thought content normal.    ED Course  Procedures (including critical care time)  Labs Reviewed  CBC - Abnormal; Notable for the following:    WBC 11.1 (*)    All other components within normal limits  POCT I-STAT, CHEM 8 - Abnormal; Notable for the following:    Glucose, Bld 102 (*)    All other components within normal limits  DIFFERENTIAL  POCT I-STAT TROPONIN I  I-STAT TROPONIN I  I-STAT, CHEM 8   No results found.   No diagnosis found.  Results for orders placed during the hospital encounter of 01/12/11  CBC      Component Value Range   WBC 11.1 (*) 4.0 - 10.5 (K/uL)   RBC 4.78  4.22 - 5.81 (MIL/uL)   Hemoglobin 15.2  13.0 - 17.0 (g/dL)   HCT 16.1  09.6 - 04.5 (%)   MCV 93.3  78.0 - 100.0 (fL)   MCH 31.8  26.0 - 34.0 (pg)   MCHC 34.1  30.0 - 36.0 (g/dL)   RDW 40.9  81.1 - 91.4 (%)   Platelets 266  150 - 400 (K/uL)  DIFFERENTIAL      Component Value Range   Neutrophils Relative 41 (*) 43 - 77 (%)   Lymphocytes Relative 45  12 - 46 (%)   Monocytes Relative 11  3 - 12 (%)   Eosinophils Relative 2  0 - 5 (%)   Basophils Relative 1  0 - 1 (%)   Neutro Abs 4.6  1.7 - 7.7 (K/uL)   Lymphs Abs 5.0 (*) 0.7 - 4.0 (K/uL)   Monocytes Absolute 1.2 (*) 0.1 - 1.0 (K/uL)   Eosinophils Absolute 0.2  0.0 - 0.7 (K/uL)   Basophils Absolute 0.1  0.0 - 0.1 (K/uL)   WBC Morphology ATYPICAL LYMPHOCYTES    POCT I-STAT, CHEM 8      Component Value Range   Sodium 141  135 - 145 (mEq/L)   Potassium 3.9  3.5 - 5.1 (mEq/L)   Chloride 108  96 - 112 (mEq/L)   BUN 11  6 - 23 (mg/dL)   Creatinine, Ser 7.82  0.50 - 1.35 (mg/dL)   Glucose, Bld 956 (*) 70 - 99 (mg/dL)   Calcium, Ion 2.13  0.86 - 1.32 (mmol/L)   TCO2 23  0 - 100 (mmol/L)   Hemoglobin 15.3  13.0 - 17.0 (g/dL)   HCT 57.8  46.9 - 62.9 (%)  POCT I-STAT TROPONIN I      Component Value Range   Troponin i, poc 0.00  0.00 - 0.08 (ng/mL)   Comment 3             Dg Chest 2 View  01/12/2011  *RADIOLOGY REPORT*  Clinical Data: Chest pain radiating down the left arm.  Shortness of breath.  Smoker.  CHEST - 2 VIEW  Comparison: 09/20/2010  Findings: Normal heart size and pulmonary vascularity.  Mild emphysematous changes with scattered fibrosis and scattered  calcified granulomas in the lungs.  No focal airspace consolidation.  No blunting of costophrenic angles.  No pneumothorax.  Mild degenerative changes in the spine. Calcification of the aorta.  Stable appearance since previous study.  IMPRESSION: No evidence of active pulmonary disease.  Original Report Authenticated By: Marlon Pel, M.D.     MDM   Date: 01/13/2011  Rate: 83  Rhythm: normal sinus rhythm  QRS Axis: left  Intervals: normal  ST/T Wave abnormalities: normal  Conduction Disutrbances:none  Narrative Interpretation: early precordial transition  Old EKG Reviewed: changes noted    Patient initially agreed to admission and then AMA at the time of the admission     Lanique Gonzalo K Kodiak Rollyson-Rasch, MD 01/13/11 [phone]

## 2011-01-12 NOTE — ED Notes (Signed)
Pt c/o left arm pain and chest tightess that has been intermittent for the past 3 days.  States pain wakes him from sleep at night.  Associated with dizziness and nausea.

## 2011-01-12 NOTE — ED Notes (Signed)
Pt c/o chest tightness and left arm aching that has been intermittent for past 3 days.  States pain worsens at night.  Pain is associated with dizziness and diaphoresis.  Chest pain increases with palpation.  Denies n/v/d, sob.

## 2011-01-12 NOTE — ED Notes (Signed)
EDP at bedside to assess pt at this time. °

## 2011-01-13 LAB — DIFFERENTIAL
Basophils Absolute: 0.1 K/uL (ref 0.0–0.1)
Basophils Relative: 1 % (ref 0–1)
Eosinophils Absolute: 0.2 K/uL (ref 0.0–0.7)
Eosinophils Relative: 2 % (ref 0–5)
Lymphocytes Relative: 45 % (ref 12–46)
Lymphs Abs: 5 K/uL — ABNORMAL HIGH (ref 0.7–4.0)
Monocytes Absolute: 1.2 K/uL — ABNORMAL HIGH (ref 0.1–1.0)
Monocytes Relative: 11 % (ref 3–12)
Neutro Abs: 4.6 K/uL (ref 1.7–7.7)
Neutrophils Relative %: 41 % — ABNORMAL LOW (ref 43–77)

## 2011-01-13 MED ORDER — NITROGLYCERIN 0.4 MG SL SUBL: 0.4000 mg | SUBLINGUAL_TABLET | SUBLINGUAL | Status: DC | PRN

## 2011-01-13 NOTE — ED Notes (Signed)
The pt is not having any pain at present.  He had a sharp pain that went away within  Seconds.

## 2011-01-13 NOTE — ED Notes (Signed)
The pt is not having any chest pain

## 2011-01-13 NOTE — ED Notes (Signed)
The pt has decided that he does not want to be admitted.  He was told of the risks and the possible consequences of leaving against medical advise.  He  Is determined to leave and will follow up with his doctor tomorrow.  .  The pt signed out ama .  He was cautioned to return if his pain gets worse or  He has any further concerns.

## 2011-01-13 NOTE — ED Notes (Signed)
The pt is not having any chest pain at present he has only minimal  Lt arm discomfort.  Up to the br 10 minutes ago and the pain did not return.  He has no previous history.  Alert oriented skin warm and dry.

## 2011-01-13 NOTE — ED Notes (Signed)
Nasal 02 at 2 administered

## 2011-01-13 NOTE — ED Notes (Signed)
nsr on the monitor 

## 2011-03-05 ENCOUNTER — Other Ambulatory Visit: Payer: Self-pay | Admitting: Urology

## 2011-03-08 ENCOUNTER — Encounter (HOSPITAL_COMMUNITY): Payer: Self-pay | Admitting: Pharmacy Technician

## 2011-03-08 ENCOUNTER — Encounter (HOSPITAL_COMMUNITY): Payer: Self-pay

## 2011-03-08 ENCOUNTER — Encounter (HOSPITAL_COMMUNITY)
Admission: RE | Admit: 2011-03-08 | Discharge: 2011-03-08 | Disposition: A | Discharge status: Home / Self Care | Payer: BC Managed Care – PPO | Source: Ambulatory Visit | Attending: Urology | Admitting: Urology

## 2011-03-08 HISTORY — DX: Anemia, unspecified: D64.9

## 2011-03-08 HISTORY — DX: Gastro-esophageal reflux disease without esophagitis: K21.9

## 2011-03-08 HISTORY — DX: Chronic kidney disease, unspecified: N18.9

## 2011-03-08 HISTORY — DX: Shortness of breath: R06.02

## 2011-03-08 LAB — BASIC METABOLIC PANEL
Calcium: 9 mg/dL (ref 8.4–10.5)
GFR calc Af Amer: >90 mL/min (ref >90)
GFR calc non Af Amer: >90 mL/min (ref >90)
Sodium: 139 mEq/L (ref 135–145)

## 2011-03-08 LAB — CBC
HCT: 41.4 % (ref 39.0–52.0)
Platelets: 315 10*3/uL (ref 150–400)
RDW: 13.8 % (ref 11.5–15.5)
WBC: 10.6 10*3/uL — ABNORMAL HIGH (ref 4.0–10.5)

## 2011-03-08 LAB — SURGICAL PCR SCREEN
MRSA, PCR: NEGATIVE
Staphylococcus aureus: NEGATIVE

## 2011-03-08 NOTE — Pre-Procedure Instructions (Signed)
03/08/11 Patient had admission of chestpain on 01/13/11.  Presented to ER.  First troponin negative. EKG and CXR done CXR adn EKG results on chart.  Patient stated at preop appt MD wanted to admit pt.  Pt stated he did not want to be admitted to hospital.  According to notes, pt left AMA.

## 2011-03-08 NOTE — Patient Instructions (Signed)
20 Gerald Taylor  03/08/2011   Your procedure is scheduled on:  03/09/11 0830am-0930am  Report to [facility] at 0630 AM.  Call this number if you have problems the morning of surgery: [phone]   Remember:   Do not eat food:After Midnight.  May have clear liquids:until Midnight .  Marland Kitchen  Take these medicines the morning of surgery with A SIP OF WATER:    Do not wear jewelry, .  Do not wear lotions, powders, or perfumes.   .  Do not bring valuables to the hospital.  Contacts, dentures or bridgework may not be worn into surgery.     Patients discharged the day of surgery will not be allowed to drive home.  Name and phone number of your driver:   Special Instructions: CHG Shower Use Special Wash: 1/2 bottle night before surgery and 1/2 bottle morning of surgery.   Please read over the following fact sheets that you were given: MRSA Information, coughing and deep breathing exercises, leg exercises

## 2011-03-08 NOTE — H&P (Signed)
History of Present Illness  [name] returns for follow-up microhematuria.  He feels better.  He has not had any flank or low back pain.  He takes Tamsulosin daily and voids well at this time.  CT scan showed bilateral renal cysts, moderate left hydronephrosis and a 7 mm distal ureteral calculus.   Past Medical History Problems  1. History of  Anxiety (Symptom) 300.00 2. History of  Heartburn 787.1 3. History of  Hemorrhoids 455.6 4. History of  Hypercholesterolemia 272.0  Surgical History Problems  1. History of  Hemorrhoidectomy  Current Meds 1. Aspirin 81 MG Oral Tablet; Therapy: (Recorded:30Jan2013) to 2. Hydrocodone-Acetaminophen 10-325 MG Oral Tablet; Therapy: (Recorded:30Jan2013) to 3. Oxycodone-Acetaminophen 5-325 MG Oral Tablet; TAKE 1 TO 2 TABLETS EVERY 4 HOURS AS  NEEDED FOR PAIN; Therapy: 31Jan2013 to (Last Rx:31Jan2013) 4. Pravachol 20 MG Oral Tablet; Therapy: (Recorded:30Jan2013) to 5. Tamsulosin HCl 0.4 MG Oral Capsule; Therapy: (Recorded:30Jan2013) to  Allergies Medication  1. No Known Drug Allergies  Family History Problems  1. Family history of  Family Health Status - Father's Age 48 2. Family history of  Family Health Status - Mother's Age 20 3. Family history of  Family Health Status Number Of Children 3 daughters 4. Family history of  Heart Disease V17.49  Social History Problems  1. Alcohol Use rarely 2. Caffeine Use rarely 3. Marital History - Currently Married 4. Occupation: Maintenence 5. Tobacco Use 305.1 1 pk for 39 yrs  Review of Systems  As per HPI.  There are no changes since his last visit of 1/30.     Results/Data Urine [Data Includes: Last 1 Day]   07Feb2013  COLOR YELLOW   APPEARANCE CLEAR   SPECIFIC GRAVITY 1.025   pH 6.0   GLUCOSE NEG mg/dL  BILIRUBIN NEG   KETONE NEG mg/dL  BLOOD TRACE   PROTEIN NEG mg/dL  UROBILINOGEN 0.2 mg/dL  NITRITE NEG   LEUKOCYTE ESTERASE NEG   SQUAMOUS EPITHELIAL/HPF RARE   WBC NONE SEEN  WBC/hpf  RBC 0-3 RBC/hpf  BACTERIA RARE   CRYSTALS NONE SEEN   CASTS Hyaline casts noted     I have independently reviewed the CT scan with the patient and the findings are as noted above.   Assessment Assessed  1. Distal Ureteral Stone On The Left 592.1 2. Hydronephrosis 591 3. Microscopic Hematuria 599.72  Plan Distal Ureteral Stone On The Left (592.1)  1. Follow-up Schedule Surgery Office  Follow-up  Done: 07Feb2013 Health Maintenance (V70.0)  2. UA With REFLEX  Done: 07Feb2013 08:53AM   I believe [name] needs stone manipulation.  The procedure, risks, benefits were explained to the patient.  The risks include but are not limited to emorrhage, infection, inability to extract the stone, ureteral injury.  He understands and wishes to proceed.   Signatures  CC: Dr Assunta Found  Electronically signed by : Su Grand, M.D.; Feb 22 2011  9:53AM

## 2011-03-08 NOTE — Pre-Procedure Instructions (Signed)
03/08/11 Gerald Taylor made aware of ER visit of 01/12/11. Pt left AMA.  She stated she would let Dr Brunilda Payor be aware.

## 2011-03-09 ENCOUNTER — Encounter (HOSPITAL_COMMUNITY)
Admission: RE | Disposition: A | Discharge status: Home / Self Care | Payer: Self-pay | Source: Ambulatory Visit | Attending: Urology

## 2011-03-09 ENCOUNTER — Encounter (HOSPITAL_COMMUNITY): Payer: Self-pay | Admitting: *Deleted

## 2011-03-09 ENCOUNTER — Ambulatory Visit (HOSPITAL_COMMUNITY): Payer: BC Managed Care – PPO | Admitting: Anesthesiology

## 2011-03-09 ENCOUNTER — Ambulatory Visit (HOSPITAL_COMMUNITY)
Admission: RE | Admit: 2011-03-09 | Discharge: 2011-03-09 | Disposition: A | Discharge status: Home / Self Care | Payer: BC Managed Care – PPO | Source: Ambulatory Visit | Attending: Urology | Admitting: Urology

## 2011-03-09 ENCOUNTER — Encounter (HOSPITAL_COMMUNITY): Payer: Self-pay | Admitting: Anesthesiology

## 2011-03-09 DIAGNOSIS — R3129 Other microscopic hematuria: Secondary | ICD-10-CM | POA: Insufficient documentation

## 2011-03-09 DIAGNOSIS — Z7982 Long term (current) use of aspirin: Secondary | ICD-10-CM | POA: Insufficient documentation

## 2011-03-09 DIAGNOSIS — Z79899 Other long term (current) drug therapy: Secondary | ICD-10-CM | POA: Insufficient documentation

## 2011-03-09 DIAGNOSIS — E7800 Pure hypercholesterolemia, unspecified: Secondary | ICD-10-CM | POA: Insufficient documentation

## 2011-03-09 DIAGNOSIS — Z01812 Encounter for preprocedural laboratory examination: Secondary | ICD-10-CM | POA: Insufficient documentation

## 2011-03-09 DIAGNOSIS — N1330 Unspecified hydronephrosis: Secondary | ICD-10-CM | POA: Insufficient documentation

## 2011-03-09 DIAGNOSIS — R1032 Left lower quadrant pain: Secondary | ICD-10-CM | POA: Insufficient documentation

## 2011-03-09 DIAGNOSIS — N201 Calculus of ureter: Secondary | ICD-10-CM | POA: Insufficient documentation

## 2011-03-09 SURGERY — CYSTOURETEROSCOPY, WITH RETROGRADE PYELOGRAM AND STENT INSERTION
Anesthesia: General | Site: Bladder | Laterality: Left | Wound class: Clean Contaminated

## 2011-03-09 MED ORDER — FENTANYL CITRATE 0.05 MG/ML IJ SOLN
INTRAMUSCULAR | Status: AC
  Filled 2011-03-09: qty 2

## 2011-03-09 MED ORDER — SODIUM CHLORIDE 0.9 % IR SOLN
Status: DC | PRN
  Administered 2011-03-09: 3000 mL

## 2011-03-09 MED ORDER — FENTANYL CITRATE 0.05 MG/ML IJ SOLN
25.0000 ug | INTRAMUSCULAR | Status: DC | PRN
  Administered 2011-03-09 (×2): 50 ug via INTRAVENOUS

## 2011-03-09 MED ORDER — LIDOCAINE HCL (CARDIAC) 20 MG/ML IV SOLN
INTRAVENOUS | Status: DC | PRN
  Administered 2011-03-09: 50 mg via INTRAVENOUS

## 2011-03-09 MED ORDER — DEXAMETHASONE SODIUM PHOSPHATE 10 MG/ML IJ SOLN
INTRAMUSCULAR | Status: DC | PRN
  Administered 2011-03-09: 10 mg via INTRAVENOUS

## 2011-03-09 MED ORDER — CEFAZOLIN SODIUM 1-5 GM-% IV SOLN
INTRAVENOUS | Status: DC | PRN
  Administered 2011-03-09: 2 g via INTRAVENOUS

## 2011-03-09 MED ORDER — FENTANYL CITRATE 0.05 MG/ML IJ SOLN
INTRAMUSCULAR | Status: DC | PRN
  Administered 2011-03-09: 100 ug via INTRAVENOUS
  Administered 2011-03-09 (×2): 50 ug via INTRAVENOUS

## 2011-03-09 MED ORDER — PROPOFOL 10 MG/ML IV BOLUS
INTRAVENOUS | Status: DC | PRN
  Administered 2011-03-09: 200 mg via INTRAVENOUS

## 2011-03-09 MED ORDER — OXYCODONE-ACETAMINOPHEN 5-325 MG PO TABS
ORAL_TABLET | ORAL | Status: AC
  Filled 2011-03-09: qty 1

## 2011-03-09 MED ORDER — IOHEXOL 300 MG/ML  SOLN
INTRAMUSCULAR | Status: DC | PRN
  Administered 2011-03-09: 28 mL

## 2011-03-09 MED ORDER — OXYCODONE-ACETAMINOPHEN 5-325 MG PO TABS: 1.0000 | ORAL_TABLET | Freq: Once | ORAL | Status: DC

## 2011-03-09 MED ORDER — SODIUM CHLORIDE 0.9 % IR SOLN: Status: DC | PRN

## 2011-03-09 MED ORDER — IOHEXOL 300 MG/ML  SOLN
INTRAMUSCULAR | Status: AC
  Filled 2011-03-09: qty 1

## 2011-03-09 MED ORDER — MIDAZOLAM HCL 5 MG/5ML IJ SOLN
INTRAMUSCULAR | Status: DC | PRN
  Administered 2011-03-09: 2 mg via INTRAVENOUS

## 2011-03-09 MED ORDER — CEFAZOLIN SODIUM-DEXTROSE 2-3 GM-% IV SOLR
INTRAVENOUS | Status: AC
  Filled 2011-03-09: qty 50

## 2011-03-09 MED ORDER — ONDANSETRON HCL 4 MG/2ML IJ SOLN
INTRAMUSCULAR | Status: DC | PRN
  Administered 2011-03-09: 4 mg via INTRAVENOUS

## 2011-03-09 MED ORDER — PROMETHAZINE HCL 25 MG/ML IJ SOLN: 6.2500 mg | INTRAMUSCULAR | Status: DC | PRN

## 2011-03-09 MED ORDER — LACTATED RINGERS IV SOLN
INTRAVENOUS | Status: DC | PRN
  Administered 2011-03-09 (×2): via INTRAVENOUS

## 2011-03-09 SURGICAL SUPPLY — 14 items
BAG URO CATCHER STRL LF (DRAPE) ×3 IMPLANT
CATH URET 5FR 28IN OPEN ENDED (CATHETERS) ×3 IMPLANT
CLOTH BEACON ORANGE TIMEOUT ST (SAFETY) ×3 IMPLANT
DRAPE CAMERA CLOSED 9X96 (DRAPES) ×3 IMPLANT
GLOVE SURG SS PI 8.0 STRL IVOR (GLOVE) ×6 IMPLANT
GOWN PREVENTION PLUS XLARGE (GOWN DISPOSABLE) ×3 IMPLANT
GOWN STRL REIN XL XLG (GOWN DISPOSABLE) ×3 IMPLANT
GUIDEWIRE STR DUAL SENSOR (WIRE) ×3 IMPLANT
MANIFOLD NEPTUNE II (INSTRUMENTS) ×3 IMPLANT
MARKER SKIN DUAL TIP RULER LAB (MISCELLANEOUS) IMPLANT
PACK CYSTO (CUSTOM PROCEDURE TRAY) ×3 IMPLANT
SHEATH ACCESS URETERAL 38CM (SHEATH) ×3 IMPLANT
STENT CONTOUR 6FRX24X.038 (STENTS) ×3 IMPLANT
TUBING CONNECTING 10 (TUBING) ×3 IMPLANT

## 2011-03-09 NOTE — Progress Notes (Signed)
Spoke to Dr. Brunilda Payor. Pain pill ordered and RN will administer. Prescriptions for pain meds and flomax to be called into patient's pharmacy [facility] pharmacy) in [place] by MD. Patient informed.

## 2011-03-09 NOTE — Op Note (Signed)
Gerald Taylor is a 56 y.o.   03/09/2011  Pre Op diagnosis: Left distal ureteral calculus.  Post Op diagnosis: No ureteral calculus was seen. General  Procedure: Cystoscopy, left retrograde pyelogram, Ureteroscopy and insertion of JJ stent  Surgeon: Wendie Simmer. Pratt Bress  Anesthesia: Gen.  Indication: Patient is a 56 years old male who had been complaining of left flank and left lower quadrant pain for 2-3 months. He was found and urinalysis to have microhematuria. CT scan revealed mild left hydro-nephrosis and a 7 mm stone in the left distal ureter. Patient has not passed the stone and has continued to complain of pain. He is scheduled today for cystoscopy and stone manipulation.  Procedure: The patient was identified by his wrist band and proper timeout was taken.  Under general anesthesia he was prepped and draped and placed in the dorsolithotomy position. A panendoscope was inserted in the bladder. The anterior urethra is normal; he has moderate prostatic hypertrophy. The bladder mucosa is normal. There is no stone or tumor in the bladder. The ureteral orifices are in normal position and shape.   Retrograde pyelogram: A sensor wire was passed through a #5 [place] open ended catheter. The open-ended catheter and the sensor wire was passed through  the cystoscope and the left ureter. The sensor wire was then removed. Contrast was then injected through the open-ended catheter. There is a filling defect in the distal ureter consistent with the known ureteral calculus. The sensor wire was then passed through the open-ended catheter up to the renal pelvis. The open-ended catheter was then removed. The bladder was emptied and the cystoscope removed.  A semirigid ureteroscope was passed in the bladder and the left ureteral orifice. The ureteroscope was advanced in the distal ureter but the stone was not visualized. The ureteroscope was then passed up to the mid ureter and I was not able to see the calculus.  At this point I removed the ureteroscope. A ureteroscope access sheath was then passed over the sensor wire in the distal ureter. A flexible ureteroscope was passed through the ureteroscope access sheath and the ureteroscope was advanced up to the renal pelvis. No stone was seen in the proximal ureter nor in the renal pelvis. The calyces were scoped and there is no evidence of stone in the calyces either. The ureteroscope was then removed. The sensor wire was passed through the ureteroscope access sheath and the ureteroscope access sheath was removed.  The sensor wire was backloaded into the cystoscope and a #6 French-[id number] double-J stent was passed over the sensor wire. The proximal curl of the double-J stent is in the renal pelvis with the distal curl in the bladder. The string was left attached to the double-J stent. The bladder was then emptied and the cystoscope and guidewire removed.  The patient tolerated the procedure well and left the OR in satisfactory condition to postanesthesia care unit.  CC: Dr. Assunta Found

## 2011-03-09 NOTE — Progress Notes (Signed)
Patient back from recovery room. Feels the urge to urinate. Up to bathroom with assist. Unable to urinate. Pain currently 6/10. No pain meds ordered. MD paged for pain meds. Currently resting. Continue to monitor.

## 2011-03-09 NOTE — Transfer of Care (Signed)
Immediate Anesthesia Transfer of Care Note  Patient: Gerald Taylor  Procedure(s) Performed: Procedure(s) (LRB): CYSTOSCOPY WITH RETROGRADE PYELOGRAM, URETEROSCOPY AND STENT PLACEMENT (Left)  Patient Location: PACU  Anesthesia Type: General  Level of Consciousness: sedated  Airway & Oxygen Therapy: Patient Spontanous Breathing and Patient connected to face mask oxygen  Post-op Assessment: Report given to PACU RN and Post -op Vital signs reviewed and stable  Post vital signs: Reviewed and stable  Complications: No apparent anesthesia complications

## 2011-03-09 NOTE — Anesthesia Postprocedure Evaluation (Signed)
  Anesthesia Post-op Note  Patient: Gerald Taylor  Procedure(s) Performed: Procedure(s) (LRB): CYSTOSCOPY WITH RETROGRADE PYELOGRAM, URETEROSCOPY AND STENT PLACEMENT (Left)  Patient Location: PACU  Anesthesia Type: General  Level of Consciousness: awake and alert   Airway and Oxygen Therapy: Patient Spontanous Breathing  Post-op Pain: mild  Post-op Assessment: Post-op Vital signs reviewed, Patient's Cardiovascular Status Stable, Respiratory Function Stable, Patent Airway and No signs of Nausea or vomiting  Post-op Vital Signs: stable  Complications: No apparent anesthesia complications

## 2011-03-09 NOTE — Anesthesia Preprocedure Evaluation (Signed)
Anesthesia Evaluation  Patient identified by MRN, date of birth, ID band Patient awake    Reviewed: Allergy & Precautions, H&P , NPO status , Patient's Chart, lab work & pertinent test results  Airway Mallampati: II TM Distance: >3 FB Neck ROM: Full    Dental No notable dental hx.    Pulmonary shortness of breath,  clear to auscultation  Pulmonary exam normal       Cardiovascular neg cardio ROS Regular Normal    Neuro/Psych Negative Neurological ROS  Negative Psych ROS   GI/Hepatic Neg liver ROS, GERD-  Medicated,  Endo/Other  Negative Endocrine ROS  Renal/GU negative Renal ROS  Genitourinary negative   Musculoskeletal negative musculoskeletal ROS (+)   Abdominal   Peds negative pediatric ROS (+)  Hematology negative hematology ROS (+)   Anesthesia Other Findings   Reproductive/Obstetrics negative OB ROS                           Anesthesia Physical Anesthesia Plan  ASA: II  Anesthesia Plan: General   Post-op Pain Management:    Induction: Intravenous  Airway Management Planned: LMA  Additional Equipment:   Intra-op Plan:   Post-operative Plan: Extubation in OR  Informed Consent: I have reviewed the patients History and Physical, chart, labs and discussed the procedure including the risks, benefits and alternatives for the proposed anesthesia with the patient or authorized representative who has indicated his/her understanding and acceptance.   Dental advisory given  Plan Discussed with: CRNA  Anesthesia Plan Comments:         Anesthesia Quick Evaluation

## 2011-03-09 NOTE — Progress Notes (Signed)
Dr. Denenny in- made aware of patient's blood pressures. 

## 2013-06-03 ENCOUNTER — Other Ambulatory Visit (HOSPITAL_COMMUNITY): Payer: Self-pay | Admitting: Family Medicine

## 2013-06-03 ENCOUNTER — Ambulatory Visit (HOSPITAL_COMMUNITY)
Admission: RE | Admit: 2013-06-03 | Discharge: 2013-06-03 | Disposition: A | Discharge status: Home / Self Care | Payer: BC Managed Care – PPO | Source: Ambulatory Visit | Attending: Family Medicine | Admitting: Family Medicine

## 2013-06-03 ENCOUNTER — Encounter (INDEPENDENT_AMBULATORY_CARE_PROVIDER_SITE_OTHER): Payer: Self-pay

## 2013-06-03 DIAGNOSIS — R05 Cough: Secondary | ICD-10-CM | POA: Insufficient documentation

## 2013-06-03 DIAGNOSIS — R059 Cough, unspecified: Secondary | ICD-10-CM

## 2013-06-03 DIAGNOSIS — I700 Atherosclerosis of aorta: Secondary | ICD-10-CM | POA: Insufficient documentation

## 2013-06-03 DIAGNOSIS — F17200 Nicotine dependence, unspecified, uncomplicated: Secondary | ICD-10-CM | POA: Insufficient documentation

## 2013-06-03 DIAGNOSIS — R0602 Shortness of breath: Secondary | ICD-10-CM | POA: Insufficient documentation

## 2013-06-03 IMAGING — CR DG CHEST 2V
2 series · 2 of 2 positions shown · non-contrast
Comparison: [DATE]

CLINICAL DATA: Cough and congestion intermittently for 1 month,
shortness of breath, smoker

EXAM:
CHEST  2 VIEW

[view not recorded (1 of 2)]
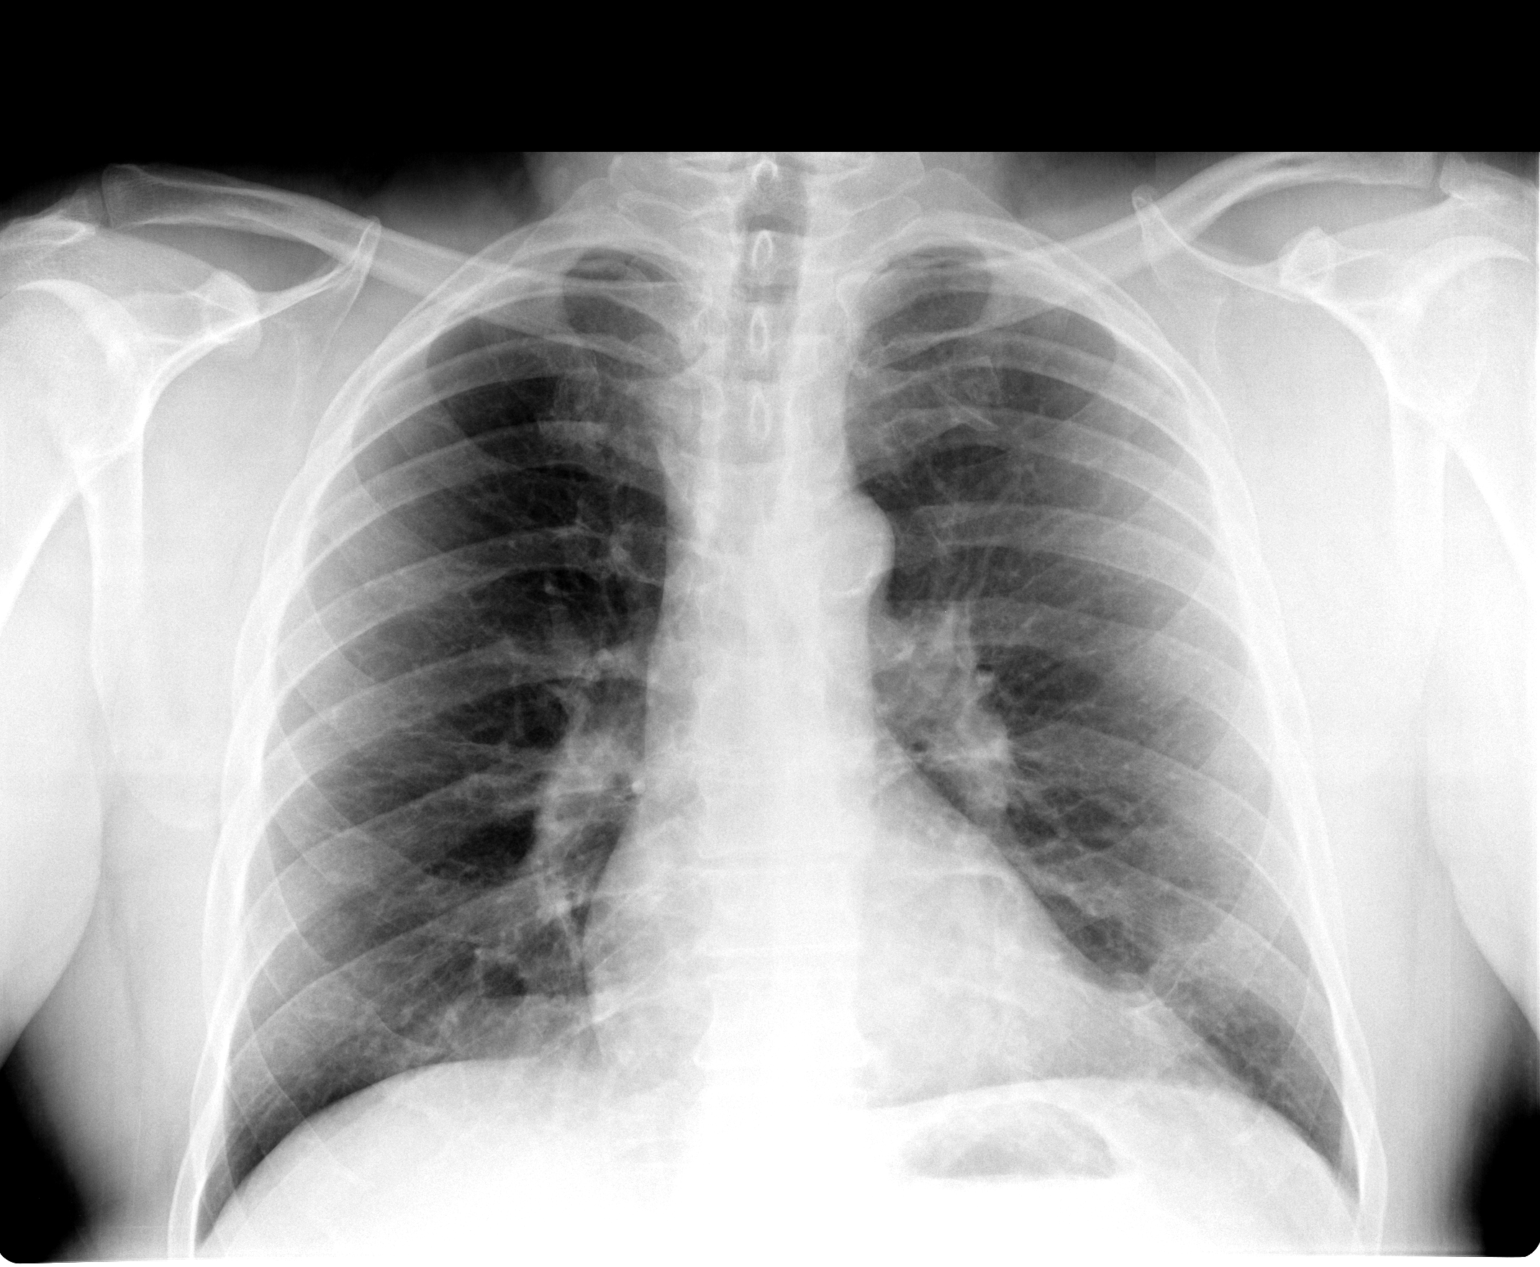

[view not recorded (2 of 2)]
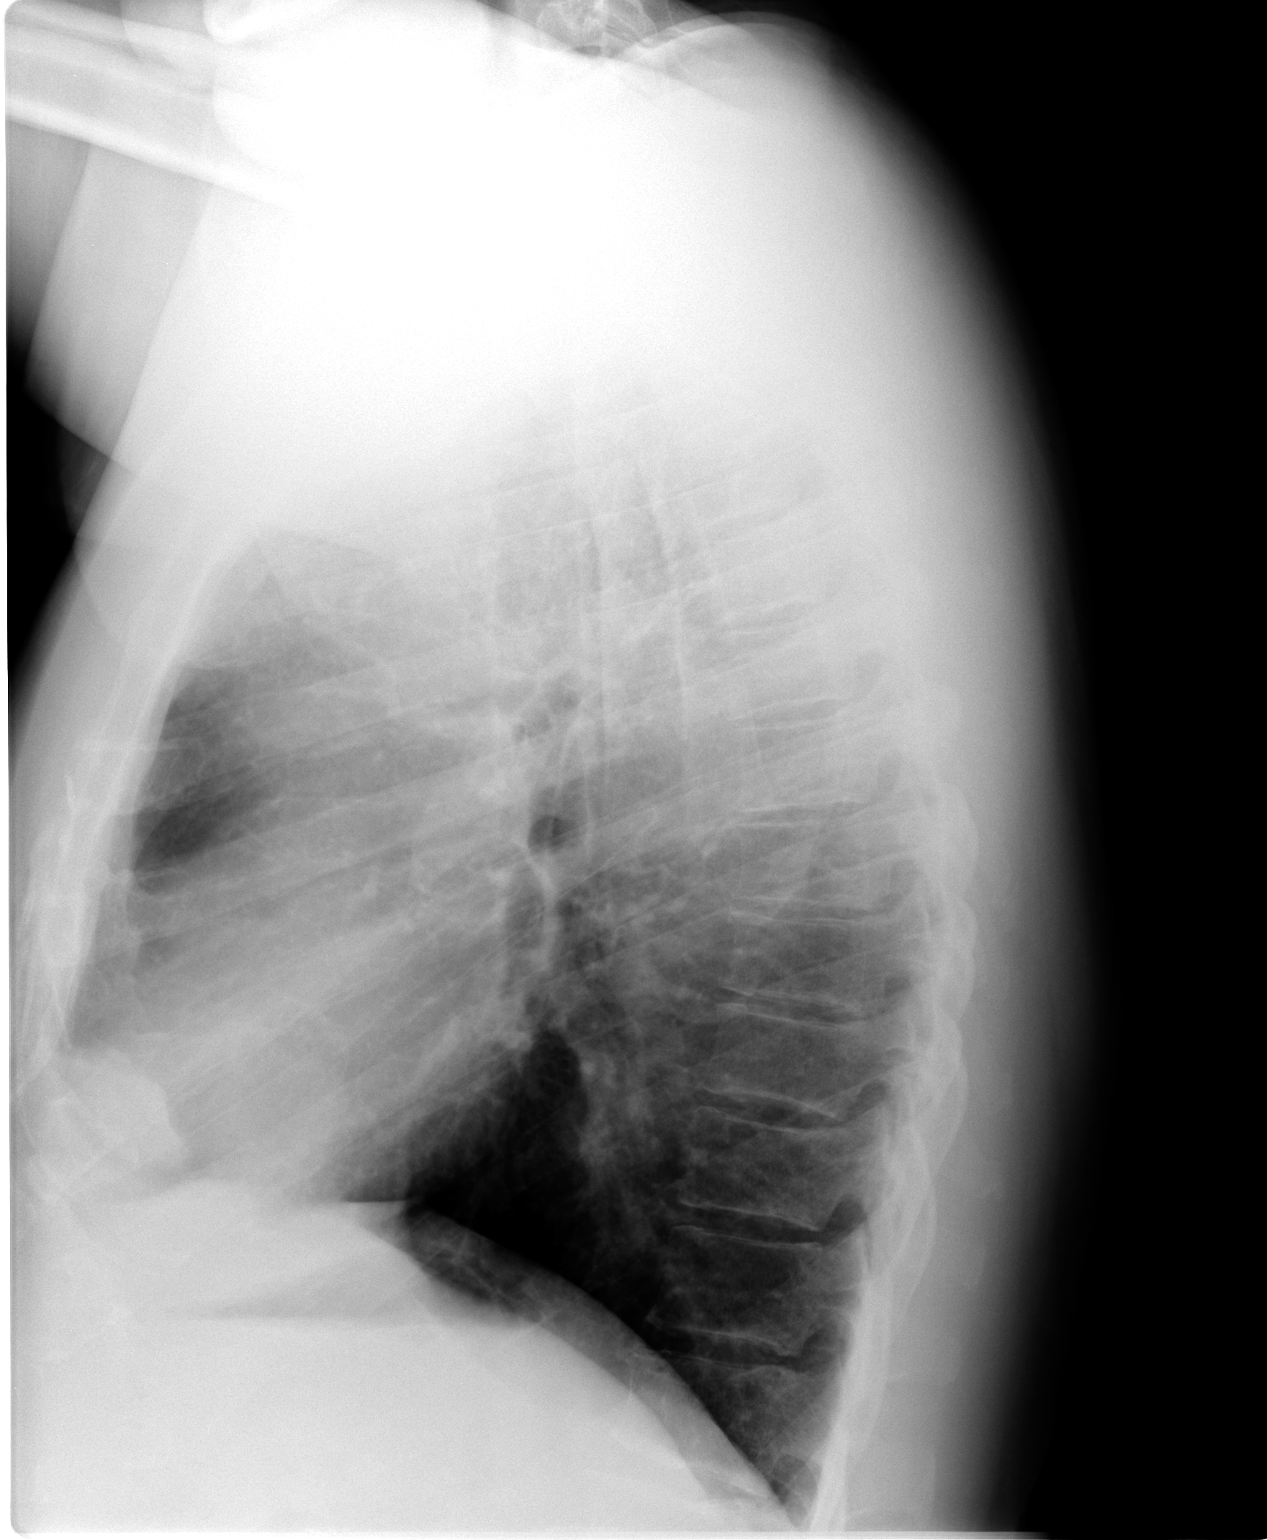

[2 of 2 positions shown; findings below may reference images not displayed]

FINDINGS: Minimally prominent LEFT hilum, unchanged since [YQ].

Normal heart size, mediastinal contours and pulmonary vascularity
otherwise normal.

Atherosclerotic calcification aorta.

Question 5 mm diameter lower RIGHT chest, questionably slightly
higher in position versus nipple shadow seen on previous exam.

No acute infiltrate, pleural effusion or pneumothorax.

No acute osseous findings.
IMPRESSION: 5 mm nodular density lower RIGHT chest, question nipple shadow,
summation artifact per tiny pulmonary nodule.

PA chest radiograph with nipple markers recommended to exclude
pulmonary nodule.

Slight chronic prominence of LEFT hilum unchanged since [DATE].

## 2013-06-04 ENCOUNTER — Other Ambulatory Visit (HOSPITAL_COMMUNITY): Payer: Self-pay | Admitting: Family Medicine

## 2013-06-04 DIAGNOSIS — R918 Other nonspecific abnormal finding of lung field: Secondary | ICD-10-CM

## 2013-07-21 ENCOUNTER — Ambulatory Visit (HOSPITAL_COMMUNITY)
Admission: RE | Admit: 2013-07-21 | Discharge: 2013-07-21 | Disposition: A | Discharge status: Home / Self Care | Payer: BC Managed Care – PPO | Source: Ambulatory Visit | Attending: Family Medicine | Admitting: Family Medicine

## 2013-07-21 DIAGNOSIS — R918 Other nonspecific abnormal finding of lung field: Secondary | ICD-10-CM

## 2013-07-21 DIAGNOSIS — R911 Solitary pulmonary nodule: Secondary | ICD-10-CM | POA: Insufficient documentation

## 2013-07-21 IMAGING — CR DG CHEST SPECIAL VIEW
1 series · 1 of 1 positions shown · non-contrast
Comparison: Chest x-ray of [DATE]

CLINICAL DATA: Question lung nodule on prior exam

EXAM:
CHEST SPECIAL VIEW

[view not recorded]
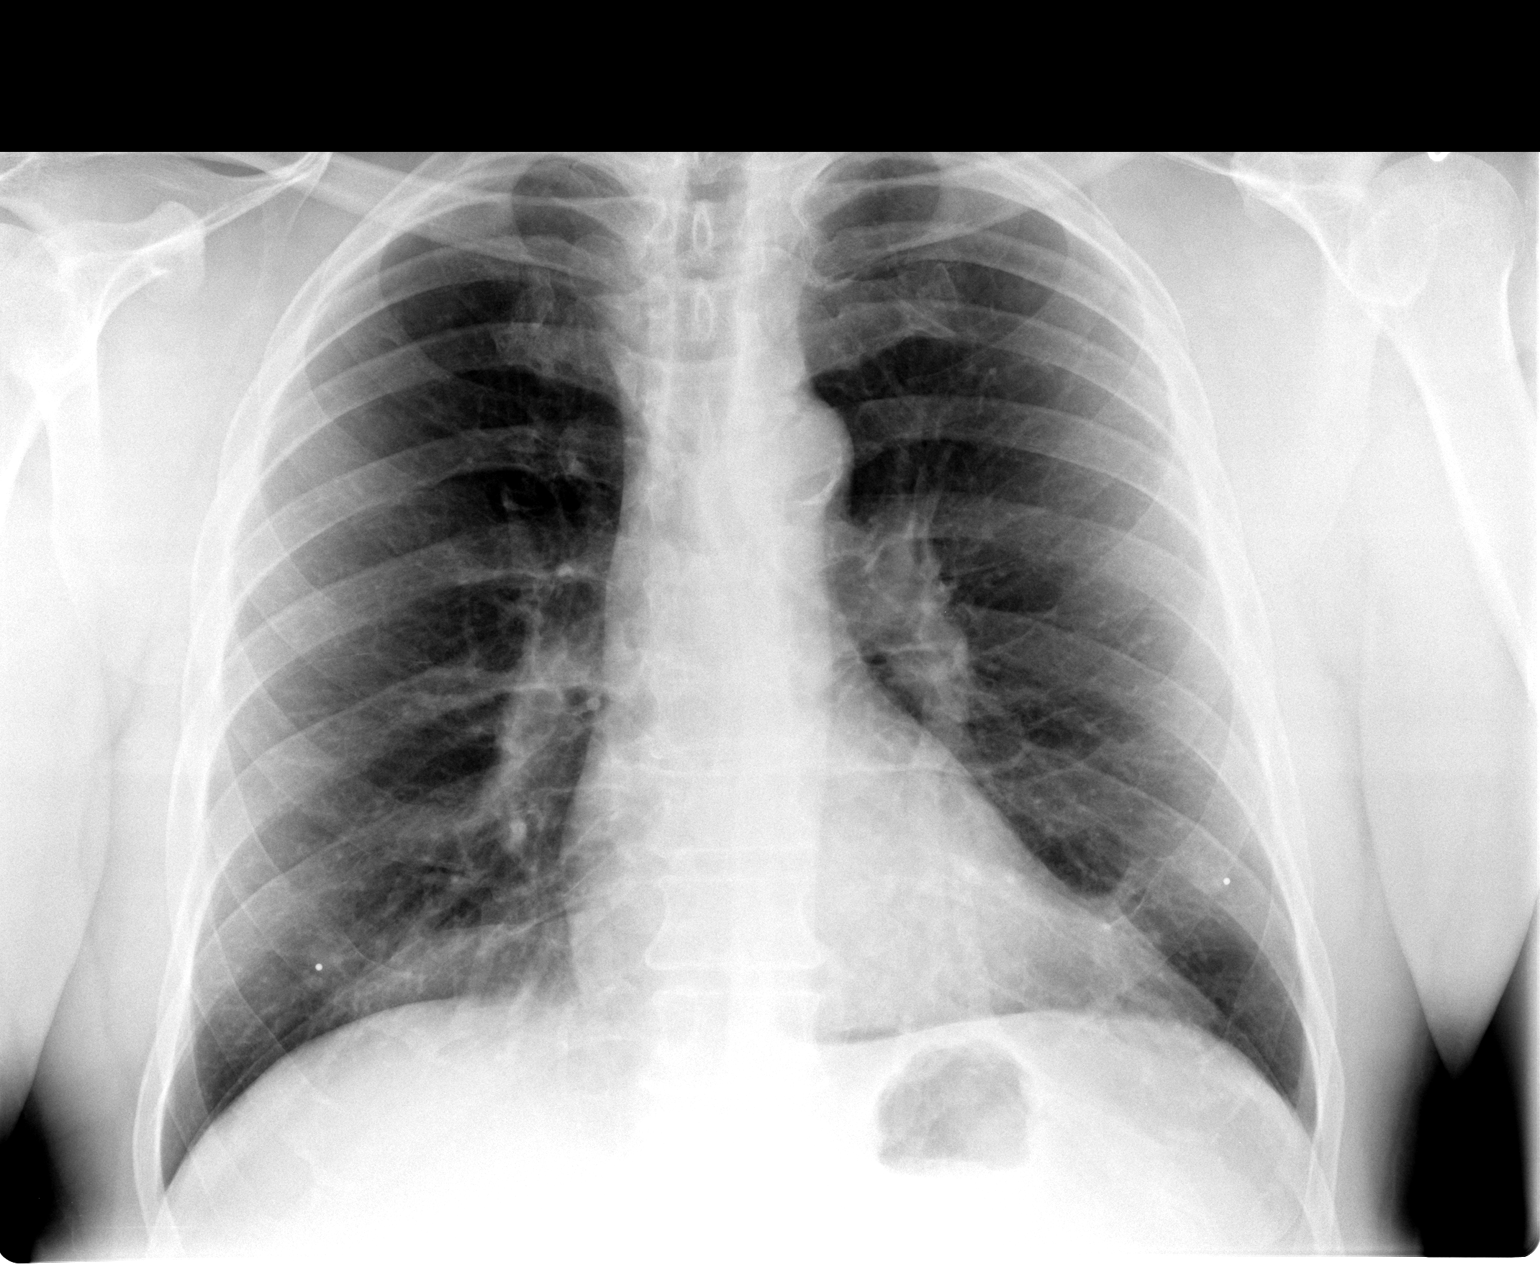

[1 of 1 positions shown; findings below may reference images not displayed]

FINDINGS: The nodular opacity questioned at the right lung base on prior chest
x-ray does appear to correspond to nipple shadow. No suspicious lung
nodule is seen. The lungs are clear. Mediastinal and hilar contours
appear normal. The heart is within normal limits in size.
IMPRESSION: No suspicious lung nodule. The nodule questioned does appear to
correspond to nipple shadow.

## 2016-06-07 ENCOUNTER — Encounter: Payer: Self-pay | Admitting: Emergency Medicine

## 2016-06-07 ENCOUNTER — Emergency Department
Admission: EM | Admit: 2016-06-07 | Discharge: 2016-06-07 | Disposition: A | Discharge status: Home / Self Care | Payer: Worker's Compensation | Attending: Student in an Organized Health Care Education/Training Program | Admitting: Student in an Organized Health Care Education/Training Program

## 2016-06-07 DIAGNOSIS — N189 Chronic kidney disease, unspecified: Secondary | ICD-10-CM | POA: Insufficient documentation

## 2016-06-07 DIAGNOSIS — W312XXA Contact with powered woodworking and forming machines, initial encounter: Secondary | ICD-10-CM | POA: Diagnosis not present

## 2016-06-07 DIAGNOSIS — Z7982 Long term (current) use of aspirin: Secondary | ICD-10-CM | POA: Diagnosis not present

## 2016-06-07 DIAGNOSIS — Y929 Unspecified place or not applicable: Secondary | ICD-10-CM | POA: Insufficient documentation

## 2016-06-07 DIAGNOSIS — S8991XA Unspecified injury of right lower leg, initial encounter: Secondary | ICD-10-CM | POA: Diagnosis present

## 2016-06-07 DIAGNOSIS — F17210 Nicotine dependence, cigarettes, uncomplicated: Secondary | ICD-10-CM | POA: Diagnosis not present

## 2016-06-07 DIAGNOSIS — S81811A Laceration without foreign body, right lower leg, initial encounter: Secondary | ICD-10-CM

## 2016-06-07 DIAGNOSIS — Y9389 Activity, other specified: Secondary | ICD-10-CM | POA: Diagnosis not present

## 2016-06-07 DIAGNOSIS — Y990 Civilian activity done for income or pay: Secondary | ICD-10-CM | POA: Diagnosis not present

## 2016-06-07 DIAGNOSIS — Z23 Encounter for immunization: Secondary | ICD-10-CM | POA: Insufficient documentation

## 2016-06-07 DIAGNOSIS — S81821A Laceration with foreign body, right lower leg, initial encounter: Secondary | ICD-10-CM | POA: Insufficient documentation

## 2016-06-07 MED ORDER — LIDOCAINE HCL (PF) 1 % IJ SOLN
INTRAMUSCULAR | Status: AC
  Filled 2016-06-07: qty 10

## 2016-06-07 MED ORDER — SULFAMETHOXAZOLE-TRIMETHOPRIM 800-160 MG PO TABS
1.0000 | ORAL_TABLET | Freq: Once | ORAL | Status: AC
  Administered 2016-06-07: 1 via ORAL
  Filled 2016-06-07: qty 1

## 2016-06-07 MED ORDER — OXYCODONE-ACETAMINOPHEN 5-325 MG PO TABS
1.0000 | ORAL_TABLET | Freq: Once | ORAL | Status: DC
  Filled 2016-06-07: qty 1

## 2016-06-07 MED ORDER — OXYCODONE-ACETAMINOPHEN 7.5-325 MG PO TABS: 1.0000 | ORAL_TABLET | ORAL | 0 refills | Status: DC | PRN

## 2016-06-07 MED ORDER — IBUPROFEN 600 MG PO TABS: 600.0000 mg | ORAL_TABLET | Freq: Four times a day (QID) | ORAL | 0 refills | Status: DC | PRN

## 2016-06-07 MED ORDER — SULFAMETHOXAZOLE-TRIMETHOPRIM 800-160 MG PO TABS: 1.0000 | ORAL_TABLET | Freq: Two times a day (BID) | ORAL | 0 refills | Status: DC

## 2016-06-07 MED ORDER — TETANUS-DIPHTH-ACELL PERTUSSIS 5-2.5-18.5 LF-MCG/0.5 IM SUSP
0.5000 mL | Freq: Once | INTRAMUSCULAR | Status: AC
  Administered 2016-06-07: 0.5 mL via INTRAMUSCULAR
  Filled 2016-06-07: qty 0.5

## 2016-06-07 NOTE — Discharge Instructions (Signed)
Keep area clean and dry. °

## 2016-06-07 NOTE — ED Provider Notes (Signed)
[facility] Emergency Department Provider Note   ____________________________________________   None    (approximate)  I have reviewed the triage vital signs and the nursing notes.   HISTORY  Chief Complaint Laceration    HPI Gerald Taylor is a 61 y.o. male patient presents with a laceration to the right lower leg. Patient state he was using a grinder at work when he slipped causing a laceration to the lower leg. Patient state bleeding is controlled direct pressure. Patient denies loss sensation or loss of function of the right lower extremity. Patient tetanus shot status is determined.Patient rates pain as a 4/10. Patient described a pain as "achy".   Past Medical History:  Diagnosis Date  . Anemia    hx of anemia as a baby  . Chronic kidney disease    bladder stone   . GERD (gastroesophageal reflux disease)    hx of   . Shortness of breath    with exertion     There are no active problems to display for this patient.   Past Surgical History:  Procedure Laterality Date  . APPENDECTOMY    . HEMORRHOID SURGERY      Prior to Admission medications   Medication Sig Start Date End Date Taking? Authorizing Provider  aspirin EC 81 MG tablet Take 81 mg by mouth at bedtime.      [provider]  HYDROcodone-acetaminophen (NORCO) 10-325 MG per tablet Take 1 tablet by mouth every 6 (six) hours as needed. For pain    [provider]  ibuprofen (ADVIL,MOTRIN) 600 MG tablet Take 1 tablet (600 mg total) by mouth every 6 (six) hours as needed. 06/07/16   Joni Reining, PA-C  OVER THE COUNTER MEDICATION Take by mouth every morning. Kidney stone herbal tea. E[employer] Her[employer]    [provider]  oxyCODONE-acetaminophen (PERCOCET) 5-325 MG per tablet Take 1 tablet by mouth every 4 (four) hours as needed. For pain    [provider]  oxyCODONE-acetaminophen (PERCOCET) 7.5-325 MG tablet Take 1 tablet by mouth every 4 (four) hours  as needed for severe pain. 06/07/16 06/07/17  Joni Reining, PA-C  pravastatin (PRAVACHOL) 20 MG tablet Take 20 mg by mouth at bedtime.    [provider]  Silodosin (RAPAFLO PO) Take 1 tablet by mouth at bedtime.    [provider]  sulfamethoxazole-trimethoprim (BACTRIM DS,SEPTRA DS) 800-160 MG tablet Take 1 tablet by mouth 2 (two) times daily. 06/07/16   Joni Reining, PA-C  Tamsulosin HCl (FLOMAX) 0.4 MG CAPS Take 0.4 mg by mouth every morning.    [provider]    Allergies Patient has no known allergies.  No family history on file.  Social History Social History  Substance Use Topics  . Smoking status: Current Every Day Smoker    Packs/day: 1.00    Years: 38.00    Types: Cigarettes  . Smokeless tobacco: Former Neurosurgeon  . Alcohol use Yes     Comment: occasional beer    Review of Systems  Constitutional: No fever/chills Eyes: No visual changes. ENT: No sore throat. Cardiovascular: Denies chest pain. Respiratory: Denies shortness of breath. Gastrointestinal: No abdominal pain.  No nausea, no vomiting.  No diarrhea.  No constipation. Genitourinary: Negative for dysuria. Musculoskeletal: Negative for back pain. Skin: Negative for rash. Dirty laceration right lower leg Neurological: Negative for headaches, focal weakness or numbness.   ____________________________________________   PHYSICAL EXAM:  VITAL SIGNS: ED Triage Vitals  Enc Vitals  Group     BP 06/07/16 1252 (!) 144/78     Pulse Rate 06/07/16 1252 91     Resp 06/07/16 1252 17     Temp 06/07/16 1252 98.2 F (36.8 C)     Temp Source 06/07/16 1252 Oral     SpO2 06/07/16 1252 96 %     Weight 06/07/16 1253 220 lb (99.8 kg)     Height 06/07/16 1253 5' 10.5" (1.791 m)     Head Circumference --      Peak Flow --      Pain Score 06/07/16 1252 4     Pain Loc --      Pain Edu? --      Excl. in GC? --     Constitutional: Alert and oriented. Well appearing and in no acute  distress. Eyes: Conjunctivae are normal. PERRL. EOMI. Head: Atraumatic. Nose: No congestion/rhinnorhea. Mouth/Throat: Mucous membranes are moist.  Oropharynx non-erythematous. Neck: No stridor.  No cervical spine tenderness to palpation.Hematological/Lymphatic/Immunilogical: No cervical lymphadenopathy. Cardiovascular: Normal rate, regular rhythm. Grossly normal heart sounds.  Good peripheral circulation. Respiratory: Normal respiratory effort.  No retractions. Lungs CTAB. Gastrointestinal: Soft and nontender. No distention. No abdominal bruits. No CVA tenderness. Musculoskeletal: No lower extremity tenderness nor edema.  No joint effusions. Neurologic:  Normal speech and language. No gross focal neurologic deficits are appreciated. No gait instability. Skin:  Skin is warm, dry and intact. No rash noted. Psychiatric: Mood and affect are normal. Speech and behavior are normal.  ____________________________________________   LABS (all labs ordered are listed, but only abnormal results are displayed)  Labs Reviewed - No data to display ____________________________________________  EKG   ____________________________________________  RADIOLOGY   ____________________________________________   PROCEDURES  Procedure(s) performed: LACERATION REPAIR Performed by: Joni Reining Authorized by: Joni Reining Consent: Verbal consent obtained. Risks and benefits: risks, benefits and alternatives were discussed Consent given by: patient Patient identity confirmed: provided demographic data Prepped and Draped in normal sterile fashion Wound explored  Laceration Location: Right lower leg  Laceration Length: 4 cm   Foreign Bodies seen. Anesthesia: local infiltration Local anesthetic: lidocaine 1% without epinephrine Anesthetic total: 8 ml Irrigation method: syringe Amount of cleaning: standard Skin closure: 3-0 nylon Number of sutures: 10 Technique: Interrupted Patient  tolerance: Patient tolerated the procedure well with no immediate complications.   Procedures  Critical Care performed: No  ____________________________________________   INITIAL IMPRESSION / ASSESSMENT AND PLAN / ED COURSE  Pertinent labs & imaging results that were available during my care of the patient were reviewed by me and considered in my medical decision making (see chart for details).  Right leg laceration. Patient given discharge care instructions. Patient given tetanus shot prior to departure. Patient advised to have suture removal in 10 days.      ____________________________________________   FINAL CLINICAL IMPRESSION(S) / ED DIAGNOSES  Final diagnoses:  Laceration of right lower extremity, initial encounter      NEW MEDICATIONS STARTED DURING THIS VISIT:  New Prescriptions   IBUPROFEN (ADVIL,MOTRIN) 600 MG TABLET    Take 1 tablet (600 mg total) by mouth every 6 (six) hours as needed.   OXYCODONE-ACETAMINOPHEN (PERCOCET) 7.5-325 MG TABLET    Take 1 tablet by mouth every 4 (four) hours as needed for severe pain.   SULFAMETHOXAZOLE-TRIMETHOPRIM (BACTRIM DS,SEPTRA DS) 800-160 MG TABLET    Take 1 tablet by mouth 2 (two) times daily.     Note:  This document was prepared using Conservation officer, historic buildings  and may include unintentional dictation errors.    Joni ReiningSmith, Kingston Shawgo K, PA-C 06/07/16 1408    Willy Eddyobinson, Patrick, MD 06/08/16 0430

## 2016-06-07 NOTE — ED Triage Notes (Signed)
Pt comes into the ED via POV c/o leg laceration from a grinder at work.  Patient is filing this as a WC claim.  Patient in NAD at this time and all bleeding under control.  Roughly a 2" laceration present on inner thigh of left leg.

## 2016-06-07 NOTE — ED Notes (Signed)
Urine drug screen performed. 

## 2017-02-19 ENCOUNTER — Encounter: Payer: Self-pay | Admitting: Internal Medicine

## 2017-02-25 ENCOUNTER — Ambulatory Visit: Payer: BLUE CROSS/BLUE SHIELD | Admitting: Cardiovascular Disease

## 2017-02-25 ENCOUNTER — Encounter: Payer: Self-pay | Admitting: Cardiovascular Disease

## 2017-02-25 VITALS — BP 154/98 | Ht 70.0 in | Wt 215.0 lb

## 2017-02-25 DIAGNOSIS — R0609 Other forms of dyspnea: Secondary | ICD-10-CM

## 2017-02-25 DIAGNOSIS — E785 Hyperlipidemia, unspecified: Secondary | ICD-10-CM

## 2017-02-25 DIAGNOSIS — R9431 Abnormal electrocardiogram [ECG] [EKG]: Secondary | ICD-10-CM

## 2017-02-25 DIAGNOSIS — I10 Essential (primary) hypertension: Secondary | ICD-10-CM | POA: Diagnosis not present

## 2017-02-25 DIAGNOSIS — R0600 Dyspnea, unspecified: Secondary | ICD-10-CM

## 2017-02-25 NOTE — Progress Notes (Signed)
CARDIOLOGY CONSULT NOTE  Patient ID: Gerald Taylor MRN: [id number] DOB/AGE: [birth year] 63 y.o.  Admit date: (Not on file) Primary Physician: Assunta Found, MD Referring Physician: Assunta Found, MD  Reason for Consultation: Exertional dyspnea and abnormal ECG  HPI: Gerald Taylor is a 62 y.o. male who is being seen today for the evaluation of exertional dyspnea and abnormal ECG at the request of Assunta Found, MD.   Past medical history includes hypertension, hyperlipidemia, and tobacco abuse.  He has been smoking 1-1/2-2 packs of cigarettes daily for over 40 years.  He has noticed increasing exertional dyspnea over the past 2 years but more so in the last few months.  He notices it more so when carrying 50 pounds or more.  He is also had declining energy levels.  He seldom has chest pain.  He denies orthopnea, palpitations, leg swelling, and paroxysmal nocturnal dyspnea.  Prior to his most recent appointment with his PCP, he said he had not seen a doctor in over 3 years.  He says his blood pressure is usually normal.  It is elevated today, 154/98.  I personally reviewed the ECG performed on 02/15/17 which demonstrated sinus rhythm with a right bundle branch block.  I reviewed labs performed on 02/15/17: Hemoglobin 15.2, white blood cells 8.1, platelets 272, BUN 9, creatinine 0.96, sodium 137, potassium 4.8, total cholesterol 241, triglycerides 178, HDL 32, LDL 173.      No Known Allergies  Current Outpatient Medications  Medication Sig Dispense Refill  . aspirin EC 81 MG tablet Take 81 mg by mouth at bedtime.      Marland Kitchen ibuprofen (ADVIL,MOTRIN) 600 MG tablet Take 1 tablet (600 mg total) by mouth every 6 (six) hours as needed. 30 tablet 0  . pravastatin (PRAVACHOL) 20 MG tablet Take 20 mg by mouth at bedtime.     No current facility-administered medications for this visit.     Past Medical History:  Diagnosis Date  . Anemia    hx of anemia as a baby  . Chronic  kidney disease    bladder stone   . GERD (gastroesophageal reflux disease)    hx of   . Shortness of breath    with exertion     Past Surgical History:  Procedure Laterality Date  . APPENDECTOMY    . HEMORRHOID SURGERY      Social History   Socioeconomic History  . Marital status: Married    Spouse name: Not on file  . Number of children: Not on file  . Years of education: Not on file  . Highest education level: Not on file  Social Needs  . Financial resource strain: Not on file  . Food insecurity - worry: Not on file  . Food insecurity - inability: Not on file  . Transportation needs - medical: Not on file  . Transportation needs - non-medical: Not on file  Occupational History  . Not on file  Tobacco Use  . Smoking status: Current Every Day Smoker    Packs/day: 1.00    Years: 38.00    Pack years: 38.00    Types: Cigarettes  . Smokeless tobacco: Former Engineer, water and Sexual Activity  . Alcohol use: Yes    Comment: occasional beer  . Drug use: No  . Sexual activity: Not on file  Other Topics Concern  . Not on file  Social History Narrative  . Not on file     No family history  of premature CAD in 1st degree relatives.  Current Meds  Medication Sig  . aspirin EC 81 MG tablet Take 81 mg by mouth at bedtime.    Marland Kitchen. ibuprofen (ADVIL,MOTRIN) 600 MG tablet Take 1 tablet (600 mg total) by mouth every 6 (six) hours as needed.  . pravastatin (PRAVACHOL) 20 MG tablet Take 20 mg by mouth at bedtime.      Review of systems complete and found to be negative unless listed above in HPI    Physical exam Blood pressure (!) 154/98, height 5\' 10"  (1.778 m), weight 215 lb (97.5 kg). General: NAD Neck: No JVD, no thyromegaly or thyroid nodule.  Lungs: Clear to auscultation bilaterally with normal respiratory effort. CV: Nondisplaced PMI. Regular rate and rhythm, normal S1/S2, no S3/S4, no murmur.  No peripheral edema.  No carotid bruit.    Abdomen: Soft, nontender,  no distention.  Skin: Intact without lesions or rashes.  Neurologic: Alert and oriented x 3.  Psych: Normal affect. Extremities: No clubbing or cyanosis.  HEENT: Normal.   ECG: Most recent ECG reviewed.   Labs: Lab Results  Component Value Date/Time   K 3.7 03/08/2011 02:55 PM   BUN 9 03/08/2011 02:55 PM   CREATININE 0.71 03/08/2011 02:55 PM   HGB 13.9 03/08/2011 02:55 PM     Lipids: No results found for: LDLCALC, LDLDIRECT, CHOL, TRIG, HDL      ASSESSMENT AND PLAN:  1.  Exertional dyspnea with abnormal ECG: Given his history of tobacco abuse, it is possible he has developed chronic obstructive lung disease.  However, an ischemic etiology cannot be ruled out given multiple risk factors including hyperlipidemia.  I will proceed with a Lexiscan Myoview stress test for further clarification. I will order a 2-D echocardiogram with Doppler to evaluate cardiac structure, function, and regional wall motion.  2.  Hypertension: Blood pressure is elevated.  He said it is usually normal.  He has not seen a doctor in over 3 years until his most recent visit with his PCP.  I will monitor this to see if further medication adjustments are warranted.  3.  Hyperlipidemia: Lipids reviewed above.  Currently on pravastatin 20 mg daily.  This was recently started.  I will monitor.     Disposition: Follow up in 6-8 weeks  Signed: Prentice DockerSuresh Naelani Lafrance, M.D., F.A.C.C.  02/25/2017, 11:12 AM

## 2017-02-25 NOTE — Patient Instructions (Signed)
Your physician recommends that you schedule a follow-up appointment in: 6-8 weeks with Dr.Koneswaran    Your physician has requested that you have an echocardiogram. Echocardiography is a painless test that uses sound waves to create images of your heart. It provides your doctor with information about the size and shape of your heart and how well your heart's chambers and valves are working. This procedure takes approximately one hour. There are no restrictions for this procedure.  Your physician has requested that you have a lexiscan myoview. For further information please visit [email or web address]. Please follow instruction sheet, as given.    Your physician recommends that you continue on your current medications as directed. Please refer to the Current Medication list given to you today.   If you need a refill on your cardiac medications before your next appointment, please call your pharmacy.      No lab work ordered today.        Thank you for choosing [facility] !

## 2017-03-04 ENCOUNTER — Encounter (HOSPITAL_BASED_OUTPATIENT_CLINIC_OR_DEPARTMENT_OTHER)
Admission: RE | Admit: 2017-03-04 | Discharge: 2017-03-04 | Disposition: A | Discharge status: Home / Self Care | Payer: BLUE CROSS/BLUE SHIELD | Source: Ambulatory Visit | Attending: Cardiovascular Disease | Admitting: Cardiovascular Disease

## 2017-03-04 ENCOUNTER — Encounter (HOSPITAL_COMMUNITY): Payer: Self-pay

## 2017-03-04 ENCOUNTER — Encounter (HOSPITAL_COMMUNITY)
Admission: RE | Admit: 2017-03-04 | Discharge: 2017-03-04 | Disposition: A | Discharge status: Home / Self Care | Payer: BLUE CROSS/BLUE SHIELD | Source: Ambulatory Visit | Attending: Cardiovascular Disease | Admitting: Cardiovascular Disease

## 2017-03-04 ENCOUNTER — Ambulatory Visit (HOSPITAL_BASED_OUTPATIENT_CLINIC_OR_DEPARTMENT_OTHER)
Admission: RE | Admit: 2017-03-04 | Discharge: 2017-03-04 | Disposition: A | Discharge status: Home / Self Care | Payer: BLUE CROSS/BLUE SHIELD | Source: Ambulatory Visit | Attending: Cardiovascular Disease | Admitting: Cardiovascular Disease

## 2017-03-04 DIAGNOSIS — R0609 Other forms of dyspnea: Secondary | ICD-10-CM | POA: Diagnosis not present

## 2017-03-04 DIAGNOSIS — R9431 Abnormal electrocardiogram [ECG] [EKG]: Secondary | ICD-10-CM

## 2017-03-04 DIAGNOSIS — Z720 Tobacco use: Secondary | ICD-10-CM | POA: Insufficient documentation

## 2017-03-04 DIAGNOSIS — R0600 Dyspnea, unspecified: Secondary | ICD-10-CM

## 2017-03-04 DIAGNOSIS — E119 Type 2 diabetes mellitus without complications: Secondary | ICD-10-CM | POA: Insufficient documentation

## 2017-03-04 DIAGNOSIS — I10 Essential (primary) hypertension: Secondary | ICD-10-CM | POA: Insufficient documentation

## 2017-03-04 LAB — ECHOCARDIOGRAM COMPLETE
AV Area VTI index: 0.9 cm2/m2
AV Area VTI: 1.95 cm2
AV Mean grad: 9 mmHg
AV Peak grad: 21 mmHg
AV area mean vel ind: 0.86 cm2/m2
AV peak Index: 0.88
AV vel: 2
AVA: 2 cm2
AVAREAMEANV: 1.92 cm2
AVCELMEANRAT: 0.55
AVLVOTPG: 7 mmHg
AVPKVEL: 227 cm/s
Ao pk vel: 0.56 m/s
CHL CUP DOP CALC LVOT VTI: 23.6 cm
DOP CAL AO MEAN VELOCITY: 138 cm/s
EERAT: 6.22
EWDT: 408 ms
FS: 39 % (ref 28–44)
IV/PV OW: 1.05
LA ID, A-P, ES: 35 mm
LA diam end sys: 35 mm
LA diam index: 1.58 cm/m2
LA vol A4C: 61.6 ml
LA vol index: 26 mL/m2
LAVOL: 57.7 mL
LDCA: 3.46 cm2
LV E/e' medial: 6.22
LV E/e'average: 6.22
LV PW d: 10.9 mm — AB (ref 0.6–1.1)
LV e' LATERAL: 7.72 cm/s
LVOT SV: 82 mL
LVOT peak VTI: 0.58 cm
LVOTD: 21 mm
LVOTPV: 128 cm/s
Lateral S' vel: 10.9 cm/s
MV Dec: 408
MV pk E vel: 48 m/s
MVPKAVEL: 60.1 m/s
P 1/2 time: 596 ms
RV TAPSE: 20.4 mm
TDI e' lateral: 7.72
TDI e' medial: 4.03
VTI: 40.8 cm
Valve area index: 0.9

## 2017-03-04 LAB — NM MYOCAR MULTI W/SPECT W/WALL MOTION / EF
CHL CUP NUCLEAR SDS: 0
CHL CUP RESTING HR STRESS: 69 {beats}/min
LV dias vol: 101 mL (ref 62–150)
LV sys vol: 51 mL
Peak HR: 104 {beats}/min
RATE: 0.37
SRS: 0
SSS: 0
TID: 1.04

## 2017-03-04 IMAGING — NM NM MYOCAR MULTI W/SPECT W/WALL MOTION & EF
2 series · 12 of 12 positions shown · non-contrast
Comparison: none

[Series 1: rest · 6.51mm/px · 6 of 64 frames shown]
[frame 6/64]
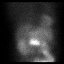
[frame 16/64]
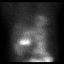
[frame 27/64]
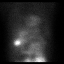
[frame 38/64]
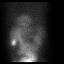
[frame 48/64]
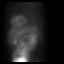
[frame 59/64]
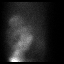

[Series 3: stress gated - perfusion · 6.51mm/px · 6 of 64 frames shown]
[frame 6/64]
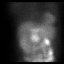
[frame 16/64]
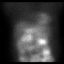
[frame 27/64]
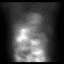
[frame 38/64]
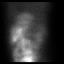
[frame 48/64]
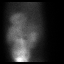
[frame 59/64]
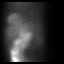

[12 of 12 positions shown; findings below may reference images not displayed]

Canned report from images found in remote index.

Refer to host system for actual result text.

## 2017-03-04 MED ORDER — REGADENOSON 0.4 MG/5ML IV SOLN
INTRAVENOUS | Status: AC
  Administered 2017-03-04: 0.4 mg via INTRAVENOUS
  Filled 2017-03-04: qty 5

## 2017-03-04 MED ORDER — SODIUM CHLORIDE 0.9% FLUSH
INTRAVENOUS | Status: AC
  Administered 2017-03-04: 10 mL via INTRAVENOUS
  Filled 2017-03-04: qty 10

## 2017-03-04 MED ORDER — TECHNETIUM TC 99M TETROFOSMIN IV KIT
30.0000 | PACK | Freq: Once | INTRAVENOUS | Status: AC | PRN
  Administered 2017-03-04: 32 via INTRAVENOUS

## 2017-03-04 MED ORDER — TECHNETIUM TC 99M TETROFOSMIN IV KIT
10.0000 | PACK | Freq: Once | INTRAVENOUS | Status: AC | PRN
  Administered 2017-03-04: 11 via INTRAVENOUS

## 2017-03-21 ENCOUNTER — Ambulatory Visit (INDEPENDENT_AMBULATORY_CARE_PROVIDER_SITE_OTHER): Payer: BLUE CROSS/BLUE SHIELD

## 2017-03-21 DIAGNOSIS — Z1211 Encounter for screening for malignant neoplasm of colon: Secondary | ICD-10-CM

## 2017-03-21 MED ORDER — PEG 3350-KCL-NA BICARB-NACL 420 G PO SOLR: 4000.0000 mL | ORAL | 0 refills | Status: DC

## 2017-03-21 NOTE — Progress Notes (Signed)
I had the wrong date on the pts AVS. I have corrected the date and printed AVS and mailed to the pt. I called and explained it to the pt and he was very understanding.

## 2017-03-21 NOTE — Patient Instructions (Addendum)
Gerald Taylor   27-Dec-1955 MRN: [id number]    Procedure Date: 05/08/17 Time to register: 11:15 Place to register: [facility] Procedure Time: 12:15 Scheduled provider: R. Garfield Cornea, MD  PREPARATION FOR COLONOSCOPY WITH TRI-LYTE SPLIT PREP  Please notify us immediately if you are diabetic, take iron supplements, or if you are on Coumadin or any other blood thinners.     You will need to purchase 1 fleet enema and 1 box of Bisacodyl '5mg'$  tablets.   2 DAYS BEFORE PROCEDURE:  DATE: 05/06/17  DAY: Monday Begin clear liquid diet AFTER your lunch meal. NO SOLID FOODS after this point.  1 DAY BEFORE PROCEDURE:  DATE: 05/07/17   DAY: Tuesday Continue clear liquids the entire day - NO SOLID FOOD.     At 2:00 pm:  Take 2 Bisacodyl tablets.   At 4:00pm:  Start drinking your solution. Make sure you mix well per instructions on the bottle. Try to drink 1 (one) 8 ounce glass every 10-15 minutes until you have consumed HALF the jug. You should complete by 6:00pm.You must keep the left over solution refrigerated until completed next day.  Continue clear liquids. You must drink plenty of clear liquids to prevent dehyration and kidney failure. Nothing to eat or drink after midnight.  EXCEPTION: If you take medications for your heart, blood pressure or breathing, you may take these medications with a small amount of clear liquid.    DAY OF PROCEDURE:   DATE: 05/08/17   DAY: Wednesday    Five hours before your procedure time @ 7:15am:  Finish remaining amout of bowel prep, drinking 1 (one) 8 ounce glass every 10-15 minutes until complete. You have two hours to consume remaining prep.   Three hours before your procedure time '@9'$ :15am:  Nothing by mouth.   At least one hour before going to the hospital:  Give yourself one Fleet enema. You may take your morning medications with sip of water unless we have instructed otherwise.      Please see below for Dietary Information.  CLEAR  LIQUIDS INCLUDE:  Water Jello (NOT red in color)   Ice Popsicles (NOT red in color)   Tea (sugar ok, no milk/cream) Powdered fruit flavored drinks  Coffee (sugar ok, no milk/cream) Gatorade/ Lemonade/ Kool-Aid  (NOT red in color)   Juice: apple, white grape, white cranberry Soft drinks  Clear bullion, consomme, broth (fat free beef/chicken/vegetable)  Carbonated beverages (any kind)  Strained chicken noodle soup Hard Candy   Remember: Clear liquids are liquids that will allow you to see your fingers on the other side of a clear glass. Be sure liquids are NOT red in color, and not cloudy, but CLEAR.  DO NOT EAT OR DRINK ANY OF THE FOLLOWING:  Dairy products of any kind   Cranberry juice Tomato juice / V8 juice   Grapefruit juice Orange juice     Red grape juice  Do not eat any solid foods, including such foods as: cereal, oatmeal, yogurt, fruits, vegetables, creamed soups, eggs, bread, crackers, pureed foods in a blender, etc.   HELPFUL HINTS FOR DRINKING PREP SOLUTION:   Make sure prep is extremely cold. Mix and refrigerate the the morning of the prep. You may also put in the freezer.   You may try mixing some Crystal Light or Country Time Lemonade if you prefer. Mix in small amounts; add more if necessary.  Try drinking through a straw  Rinse mouth with water or a mouthwash between  glasses, to remove after-taste.  Try sipping on a cold beverage /ice/ popsicles between glasses of prep.  Place a piece of sugar-free hard candy in mouth between glasses.  If you become nauseated, try consuming smaller amounts, or stretch out the time between glasses. Stop for 30-60 minutes, then slowly start back drinking.        OTHER INSTRUCTIONS  You will need a responsible adult at least 62 years of age to accompany you and drive you home. This person must remain in the waiting room during your procedure. The hospital will cancel your procedure if you do not have a responsible adult with  you.   1. Wear loose fitting clothing that is easily removed. 2. Leave jewelry and other valuables at home.  3. Remove all body piercing jewelry and leave at home. 4. Total time from sign-in until discharge is approximately 2-3 hours. 5. You should go home directly after your procedure and rest. You can resume normal activities the day after your procedure. 6. The day of your procedure you should not:  Drive  Make legal decisions  Operate machinery  Drink alcohol  Return to work   You may call the office (Dept: [phone]) before 5:00pm, or page the doctor on call ([phone]) after 5:00pm, for further instructions, if necessary.   Insurance Information YOU WILL NEED TO CHECK WITH YOUR INSURANCE COMPANY FOR THE BENEFITS OF COVERAGE YOU HAVE FOR THIS PROCEDURE.  UNFORTUNATELY, NOT ALL INSURANCE COMPANIES HAVE BENEFITS TO COVER ALL OR PART OF THESE TYPES OF PROCEDURES.  IT IS YOUR RESPONSIBILITY TO CHECK YOUR BENEFITS, HOWEVER, WE WILL BE GLAD TO ASSIST YOU WITH ANY CODES YOUR INSURANCE COMPANY MAY NEED.    PLEASE NOTE THAT MOST INSURANCE COMPANIES WILL NOT COVER A SCREENING COLONOSCOPY FOR PEOPLE UNDER THE AGE OF 50  IF YOU HAVE BCBS INSURANCE, YOU MAY HAVE BENEFITS FOR A SCREENING COLONOSCOPY BUT IF POLYPS ARE FOUND THE DIAGNOSIS WILL CHANGE AND THEN YOU MAY HAVE A DEDUCTIBLE THAT WILL NEED TO BE MET. SO PLEASE MAKE SURE YOU CHECK YOUR BENEFITS FOR A SCREENING COLONOSCOPY AS WELL AS A DIAGNOSTIC COLONOSCOPY.  

## 2017-03-21 NOTE — Progress Notes (Addendum)
Gastroenterology Pre-Procedure Review  Request Date:03/21/17 Requesting Physician: Dr.Golding (last tcs 05/10/03 Dr.Rourk no polyps)  PATIENT REVIEW QUESTIONS: The patient responded to the following health history questions as indicated:    1. Diabetes Melitis: no 2. Joint replacements in the past 12 months: no 3. Major health problems in the past 3 months: no 4. Has an artificial valve or MVP: no 5. Has a defibrillator: no 6. Has been advised in past to take antibiotics in advance of a procedure like teeth cleaning: no 7. Family history of colon cancer: yes (grandma)  8. Alcohol Use: no 9. History of sleep apnea: no  10. History of coronary artery or other vascular stents placed within the last 12 months: no 11. History of any prior anesthesia complications: no    MEDICATIONS & ALLERGIES:    Patient reports the following regarding taking any blood thinners:   Plavix? no Aspirin? yes (81mg ) Coumadin? no Brilinta? no Xarelto? no Eliquis? no Pradaxa? no Savaysa? no Effient? no  Patient confirms/reports the following medications:  Current Outpatient Medications  Medication Sig Dispense Refill  . aspirin EC 81 MG tablet Take 81 mg by mouth at bedtime.      Marland Kitchen. buPROPion (WELLBUTRIN XL) 150 MG 24 hr tablet daily.  5  . pravastatin (PRAVACHOL) 20 MG tablet Take 20 mg by mouth at bedtime.     No current facility-administered medications for this visit.     Patient confirms/reports the following allergies:  No Known Allergies  No orders of the defined types were placed in this encounter.   AUTHORIZATION INFORMATION [place]: [id number] Pre-Cert / Berkley HarveyAuth required: no    SCHEDULE INFORMATION: Procedure has been scheduled as follows:  Date:05/08/17 , Time: 12:15 Location: [facility] Dr.Rourk  This Gastroenterology Pre-Precedure Review Form is being routed to the following provider(s): Wynne DustEric Gill NP

## 2017-04-19 ENCOUNTER — Telehealth: Payer: Self-pay | Admitting: Internal Medicine

## 2017-04-19 NOTE — Telephone Encounter (Signed)
Pt called to cancel his procedure with RMR on 4/24. He said it would have to be sometime this winter when he will have the time to have it done.

## 2017-04-22 NOTE — Telephone Encounter (Signed)
I have taken him off the schedule and I called Eber Jonesarolyn and LM for her to cancel his procedure.

## 2017-04-29 ENCOUNTER — Ambulatory Visit: Payer: Self-pay | Admitting: Cardiovascular Disease

## 2017-05-03 ENCOUNTER — Ambulatory Visit: Payer: Self-pay | Admitting: Cardiovascular Disease

## 2017-05-08 ENCOUNTER — Encounter (HOSPITAL_COMMUNITY): Payer: Self-pay

## 2017-05-08 ENCOUNTER — Ambulatory Visit (HOSPITAL_COMMUNITY): Admit: 2017-05-08 | Payer: BLUE CROSS/BLUE SHIELD | Admitting: Internal Medicine

## 2017-05-08 SURGERY — COLONOSCOPY
Anesthesia: Moderate Sedation

## 2019-05-01 ENCOUNTER — Ambulatory Visit: Payer: Self-pay | Attending: Internal Medicine

## 2019-05-01 DIAGNOSIS — Z23 Encounter for immunization: Secondary | ICD-10-CM

## 2019-05-01 NOTE — Progress Notes (Signed)
   Covid-19 Vaccination Clinic  Name:  Gerald Taylor    MRN: [id number] DOB: 12/16/1955  05/01/2019  [name] was observed post Covid-19 immunization for 15 minutes without incident. He was provided with Vaccine Information Sheet and instruction to access the V-Safe system.   [name] was instructed to call 911 with any severe reactions post vaccine: Marland Kitchen Difficulty breathing  . Swelling of face and throat  . A fast heartbeat  . A bad rash all over body  . Dizziness and weakness   Immunizations Administered    Name Date Dose VIS Date Route   Moderna COVID-19 Vaccine 05/01/2019  9:37 AM 0.5 mL 12/16/2018 Intramuscular   Manufacturer: Moderna   Lot: [id number]   NDC: 47125-271-29

## 2019-06-02 ENCOUNTER — Ambulatory Visit: Payer: Self-pay | Attending: Internal Medicine

## 2019-06-02 DIAGNOSIS — Z23 Encounter for immunization: Secondary | ICD-10-CM

## 2019-06-02 NOTE — Progress Notes (Signed)
   Covid-19 Vaccination Clinic  Name:  MERVIN RAMIRES    MRN: [id number] DOB: November 15, 1955  06/02/2019  Mr. Lax was observed post Covid-19 immunization for 15 minutes without incident. He was provided with Vaccine Information Sheet and instruction to access the V-Safe system.   Mr. Primeau was instructed to call 911 with any severe reactions post vaccine: Marland Kitchen Difficulty breathing  . Swelling of face and throat  . A fast heartbeat  . A bad rash all over body  . Dizziness and weakness   Immunizations Administered    Name Date Dose VIS Date Route   Moderna COVID-19 Vaccine 06/02/2019  9:27 AM 0.5 mL 12/2018 Intramuscular   Manufacturer: Moderna   Lot: [id number]   NDC: 10071-219-75

## 2019-08-06 ENCOUNTER — Other Ambulatory Visit: Payer: Self-pay

## 2019-08-06 ENCOUNTER — Inpatient Hospital Stay (HOSPITAL_COMMUNITY)
Admission: EM | Admit: 2019-08-06 | Discharge: 2019-08-10 | DRG: 066 | Disposition: A | Discharge status: Home Health | Payer: Self-pay | Attending: Internal Medicine | Admitting: Internal Medicine

## 2019-08-06 ENCOUNTER — Emergency Department (HOSPITAL_COMMUNITY): Payer: Self-pay

## 2019-08-06 ENCOUNTER — Encounter (HOSPITAL_COMMUNITY): Payer: Self-pay

## 2019-08-06 DIAGNOSIS — I639 Cerebral infarction, unspecified: Principal | ICD-10-CM

## 2019-08-06 DIAGNOSIS — Z720 Tobacco use: Secondary | ICD-10-CM

## 2019-08-06 DIAGNOSIS — Z6830 Body mass index (BMI) 30.0-30.9, adult: Secondary | ICD-10-CM

## 2019-08-06 DIAGNOSIS — I4510 Unspecified right bundle-branch block: Secondary | ICD-10-CM | POA: Diagnosis present

## 2019-08-06 DIAGNOSIS — Z79899 Other long term (current) drug therapy: Secondary | ICD-10-CM

## 2019-08-06 DIAGNOSIS — Z20822 Contact with and (suspected) exposure to covid-19: Secondary | ICD-10-CM | POA: Diagnosis present

## 2019-08-06 DIAGNOSIS — R29702 NIHSS score 2: Secondary | ICD-10-CM | POA: Diagnosis present

## 2019-08-06 DIAGNOSIS — F17210 Nicotine dependence, cigarettes, uncomplicated: Secondary | ICD-10-CM | POA: Diagnosis present

## 2019-08-06 DIAGNOSIS — Z8249 Family history of ischemic heart disease and other diseases of the circulatory system: Secondary | ICD-10-CM

## 2019-08-06 DIAGNOSIS — E669 Obesity, unspecified: Secondary | ICD-10-CM | POA: Diagnosis present

## 2019-08-06 DIAGNOSIS — I10 Essential (primary) hypertension: Secondary | ICD-10-CM | POA: Diagnosis present

## 2019-08-06 DIAGNOSIS — K219 Gastro-esophageal reflux disease without esophagitis: Secondary | ICD-10-CM | POA: Diagnosis present

## 2019-08-06 DIAGNOSIS — Z7982 Long term (current) use of aspirin: Secondary | ICD-10-CM

## 2019-08-06 DIAGNOSIS — D72829 Elevated white blood cell count, unspecified: Secondary | ICD-10-CM | POA: Diagnosis present

## 2019-08-06 DIAGNOSIS — R739 Hyperglycemia, unspecified: Secondary | ICD-10-CM | POA: Diagnosis not present

## 2019-08-06 LAB — COMPREHENSIVE METABOLIC PANEL
ALT: 24 U/L (ref 0–44)
AST: 23 U/L (ref 15–41)
Albumin: 4.3 g/dL (ref 3.5–5.0)
Alkaline Phosphatase: 52 U/L (ref 38–126)
Anion gap: 12 (ref 5–15)
BUN: 9 mg/dL (ref 8–23)
CO2: 20 mmol/L — ABNORMAL LOW (ref 22–32)
Calcium: 8.9 mg/dL (ref 8.9–10.3)
Chloride: 106 mmol/L (ref 98–111)
Creatinine, Ser: 0.84 mg/dL (ref 0.61–1.24)
GFR calc Af Amer: >60 mL/min (ref >60)
GFR calc non Af Amer: >60 mL/min (ref >60)
Glucose, Bld: 145 mg/dL — ABNORMAL HIGH (ref 70–99)
Potassium: 3.8 mmol/L (ref 3.5–5.1)
Sodium: 138 mmol/L (ref 135–145)
Total Bilirubin: 0.4 mg/dL (ref 0.3–1.2)
Total Protein: 7.6 g/dL (ref 6.5–8.1)

## 2019-08-06 LAB — CBC
HCT: 46.7 % (ref 39.0–52.0)
Hemoglobin: 15.4 g/dL (ref 13.0–17.0)
MCH: 30.6 pg (ref 26.0–34.0)
MCHC: 33 g/dL (ref 30.0–36.0)
MCV: 92.7 fL (ref 80.0–100.0)
Platelets: 290 10*3/uL (ref 150–400)
RBC: 5.04 MIL/uL (ref 4.22–5.81)
RDW: 14.2 % (ref 11.5–15.5)
WBC: 13.2 10*3/uL — ABNORMAL HIGH (ref 4.0–10.5)
nRBC: 0 % (ref 0.0–0.2)

## 2019-08-06 LAB — SARS CORONAVIRUS 2 BY RT PCR (HOSPITAL ORDER, PERFORMED IN ~~LOC~~ HOSPITAL LAB): SARS Coronavirus 2: NEGATIVE

## 2019-08-06 IMAGING — CT CT HEAD W/O CM
3 series · 16 of 47 positions shown, 19 images · non-contrast
Comparison: None.

CLINICAL DATA: Dizziness

EXAM:
CT HEAD WITHOUT CONTRAST
TECHNIQUE: Contiguous axial images were obtained from the base of the skull
through the vertex without intravenous contrast.

[Series 2: head w o · axial · 0.46mm/px · z∈[+56,+211]mm · 10 of 37 slices shown, 13 images]
[im 3/37  brain]
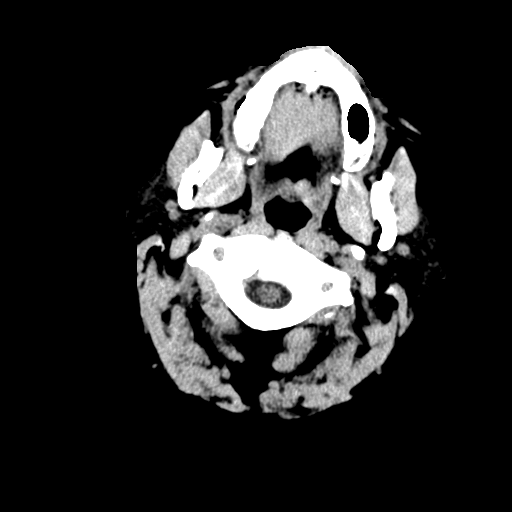
[im 3/37  bone]
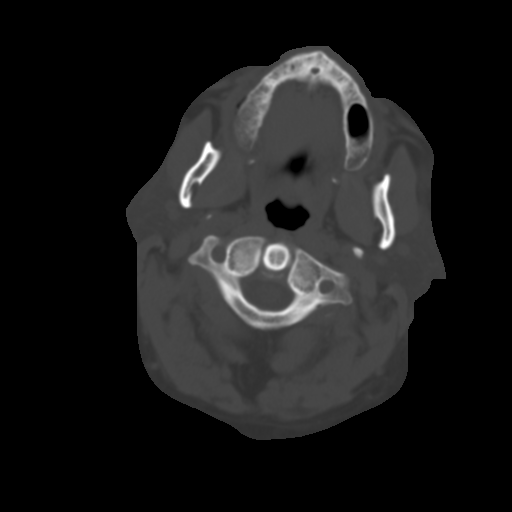
[im 7/37  brain]
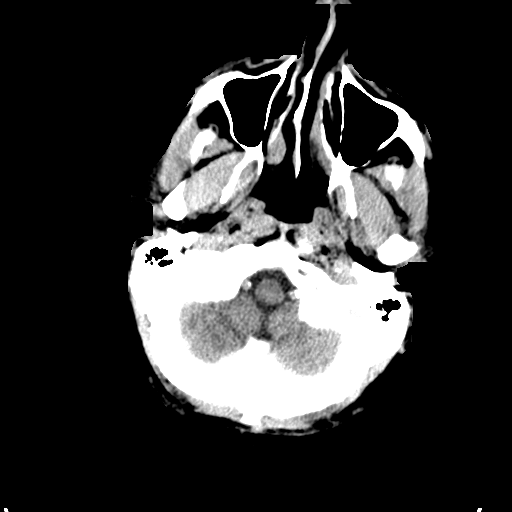
[im 10/37  brain]
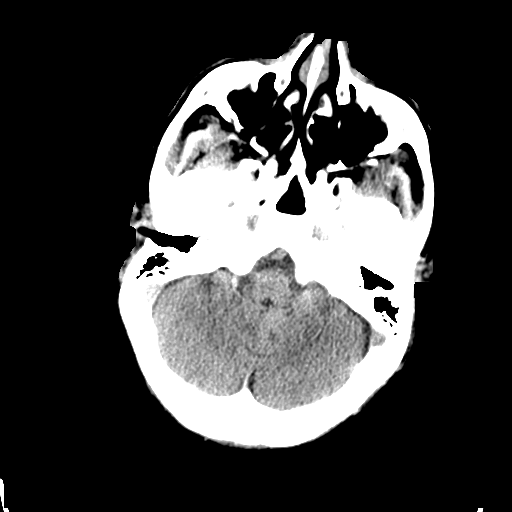
[im 13/37  brain]
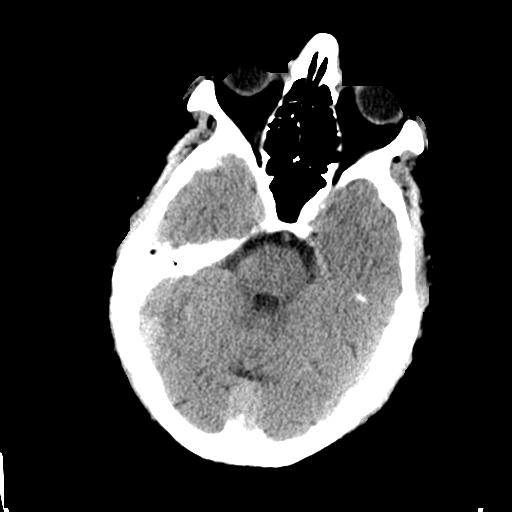
[im 17/37  brain]
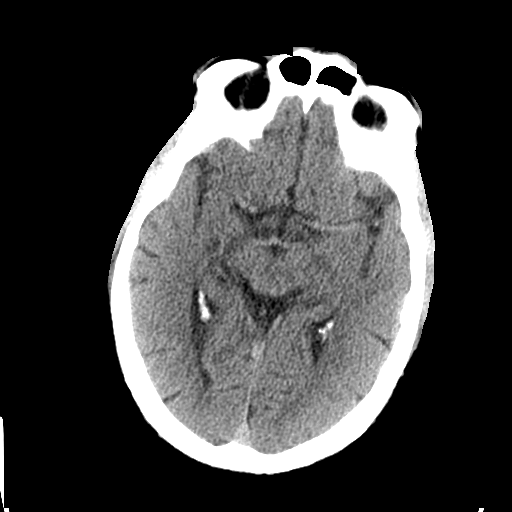
[im 17/37  bone]
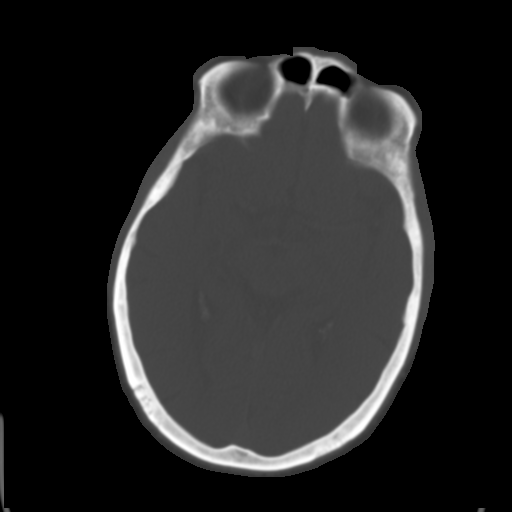
[im 20/37  brain]
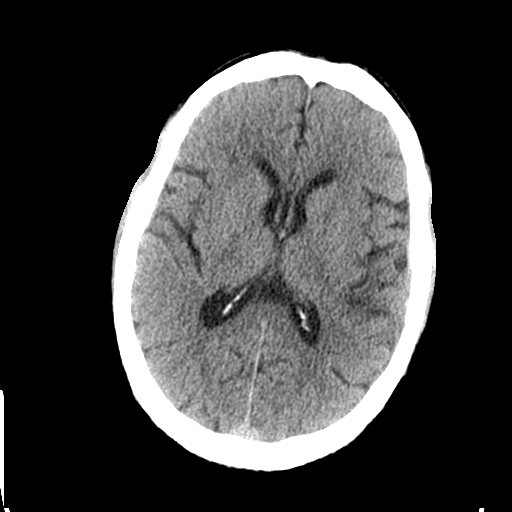
[im 24/37  brain]
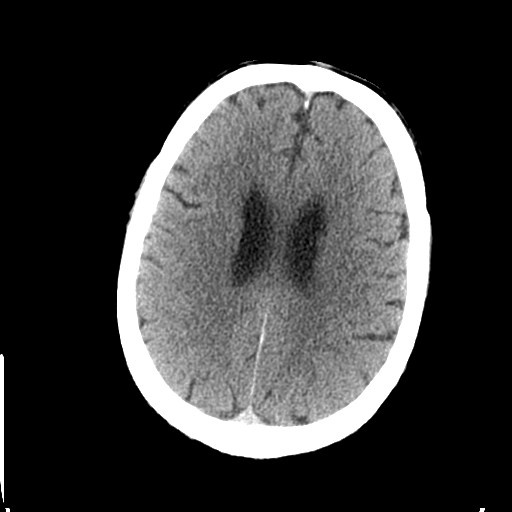
[im 28/37  brain]
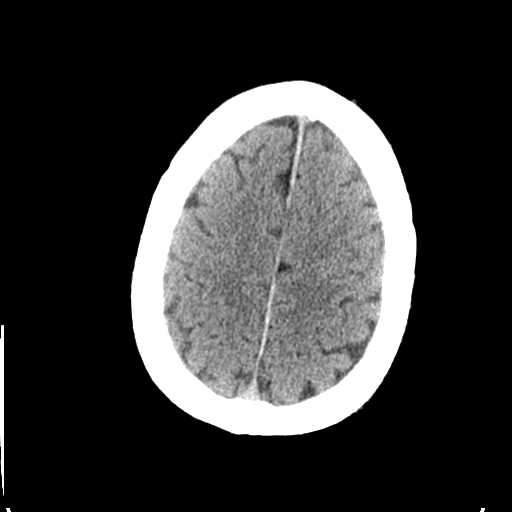
[im 30/37  brain]
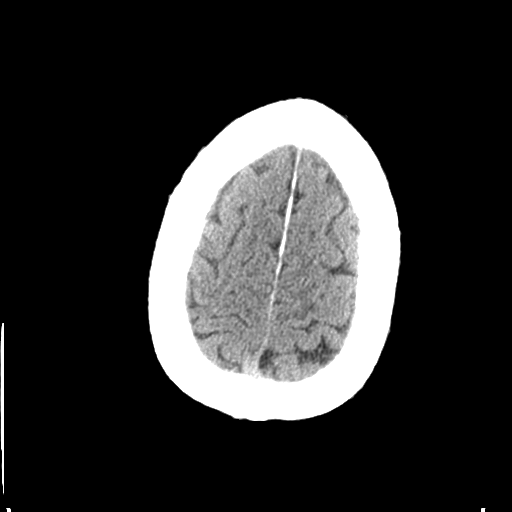
[im 30/37  bone]
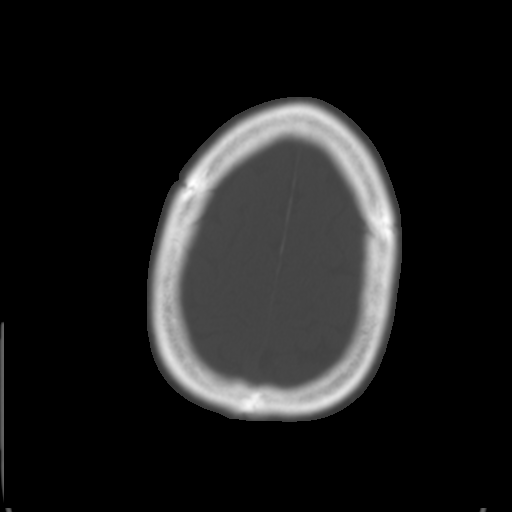
[im 34/37  brain]
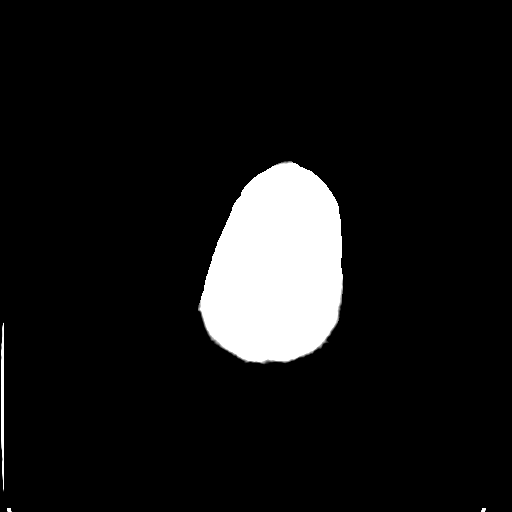

[Series 4: coronal soft · coronal · 0.32mm/px · 3 of 72 slices shown]
[im 24/72  brain]
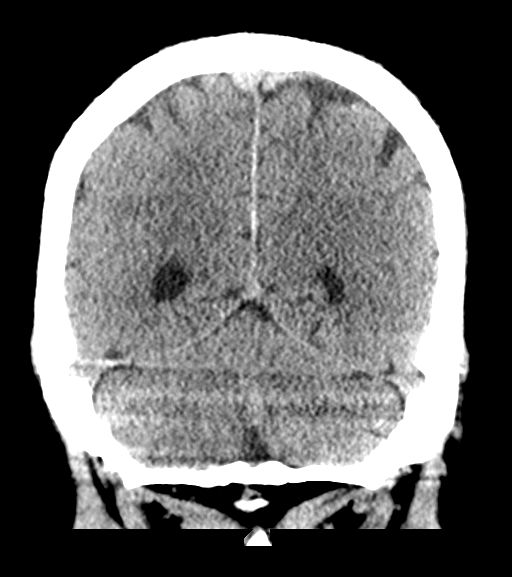
[im 32/72  brain]
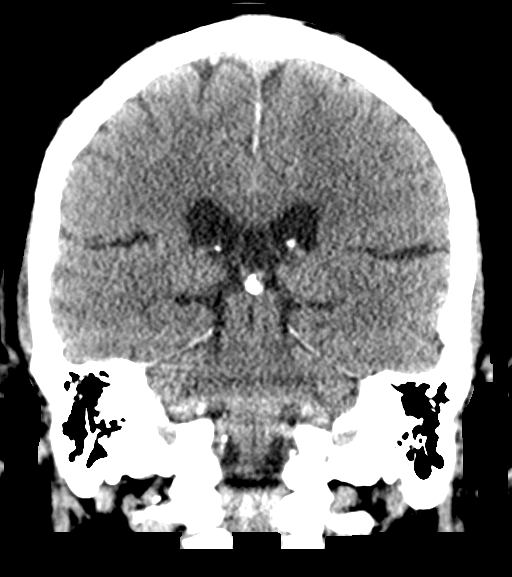
[im 40/72  brain]
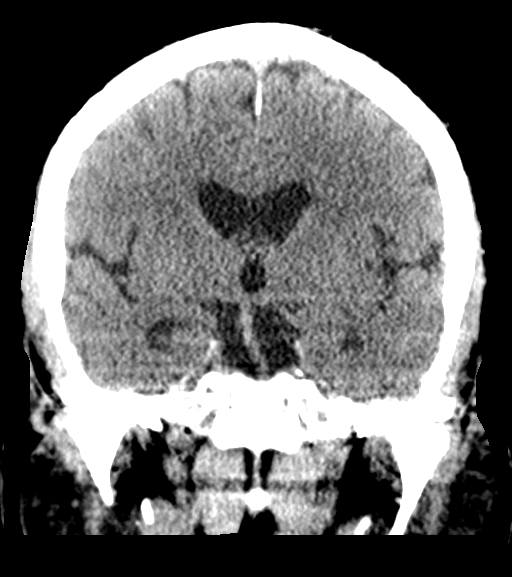

[Series 5: sagittal soft · sagittal · 0.37mm/px · 3 of 63 slices shown]
[im 21/63  brain]
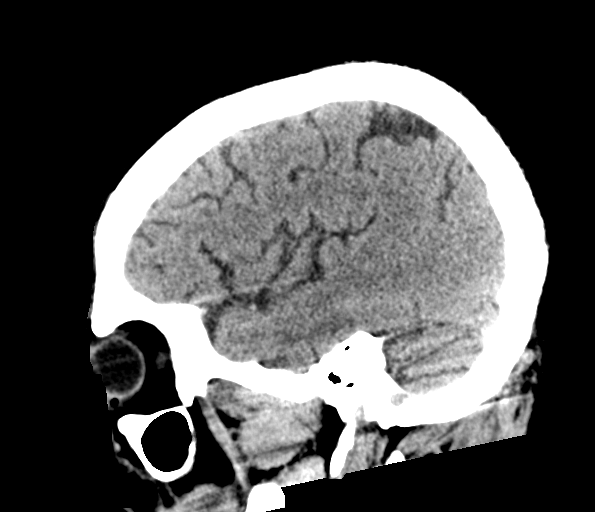
[im 32/63  brain]
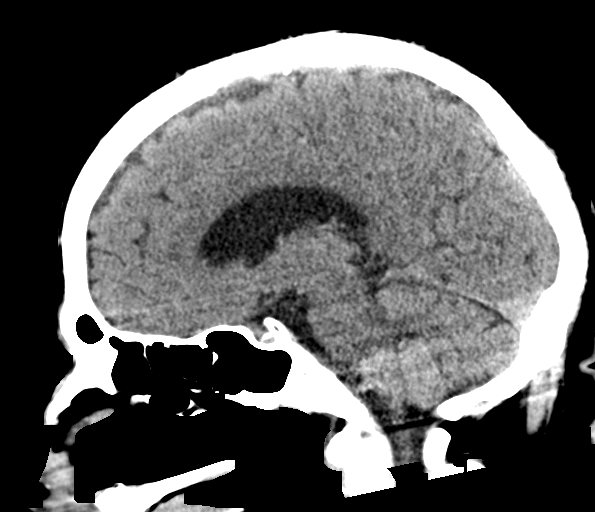
[im 42/63  brain]
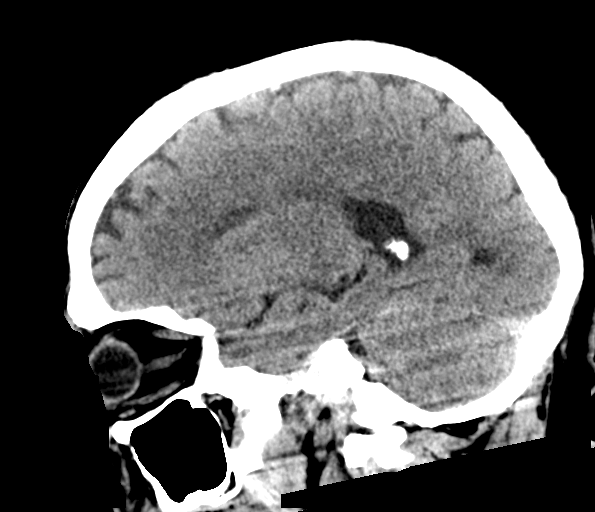

[16 of 47 positions shown; findings below may reference images not displayed]

FINDINGS: Brain: There is no acute intracranial hemorrhage, mass effect, or
edema. Gray-white differentiation is preserved. There is no
extra-axial fluid collection. Ventricles and sulci are within normal
limits in size and configuration. Minimal patchy hypoattenuation in
the supratentorial white matter is nonspecific but may reflect minor
chronic microvascular ischemic changes.

Vascular: There is atherosclerotic calcification at the skull base.

Skull: Calvarium is unremarkable.

Sinuses/Orbits: No acute finding.

Other: None.
IMPRESSION: No acute intracranial abnormality.

## 2019-08-06 IMAGING — MR MR MRA NECK WO/W CM
4 of 5 series · 28 of 48 positions shown · IV contrast (gadavist)
Comparison: None.

CLINICAL DATA: Dizziness and nausea.

EXAM:
MRA NECK WITHOUT AND WITH CONTRAST
TECHNIQUE: Multiplanar and multiecho pulse sequences of the neck were obtained
without and with intravenous contrast. Angiographic images of the
neck were obtained using MRA technique without and with intravenous
contrast.
No fat-suppressed T1-weighted sequence was performed.
CONTRAST:  10mL GADAVIST GADOBUTROL 1 MMOL/ML IV SOLN

[Series 7: tof_fl3d_tra_iso · axial · B · 0.6mm · 0.52mm/px · z∈[-6,+74]mm · 9 of 133 slices shown]
[im 1/133]
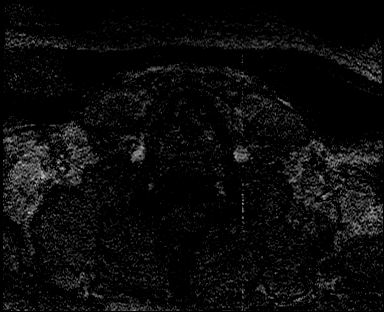
[im 19/133]
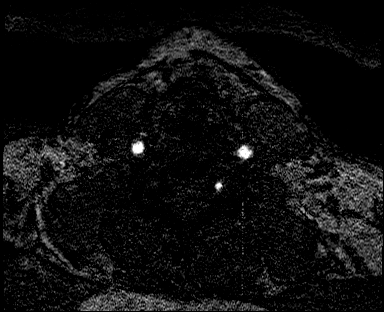
[im 38/133]
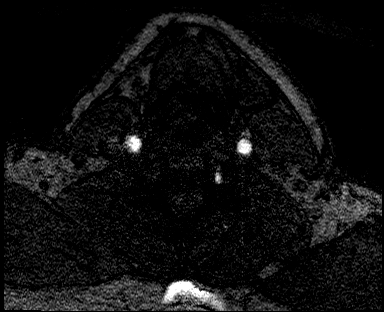
[im 57/133]
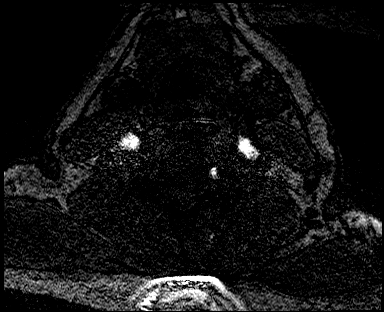
[im 67/133]
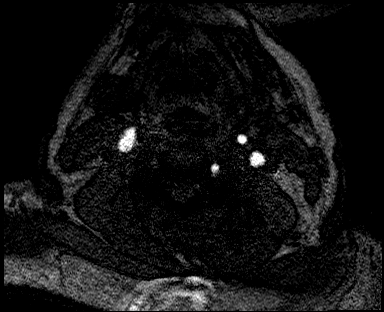
[im 76/133]
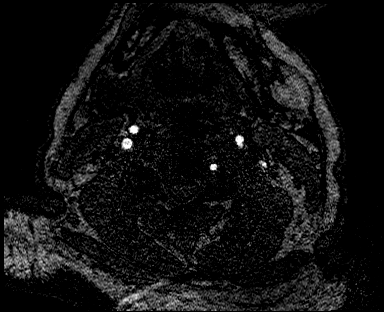
[im 95/133]
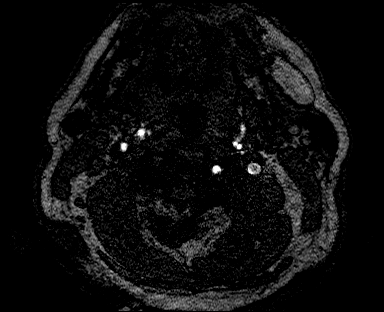
[im 114/133]
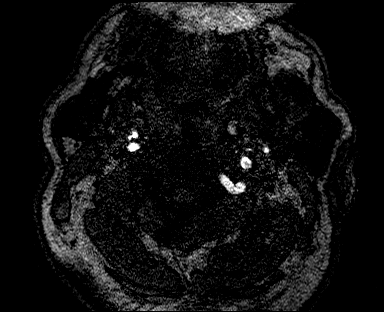
[im 133/133]
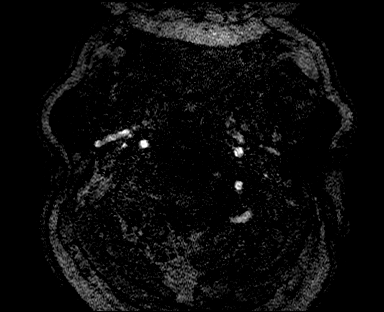

[Series 10: angio_fl3d_cor_highres_pre_ttc=3.0s · coronal · B · 0.9mm · 0.62mm/px · 9 of 80 slices shown]
[im 1/80]
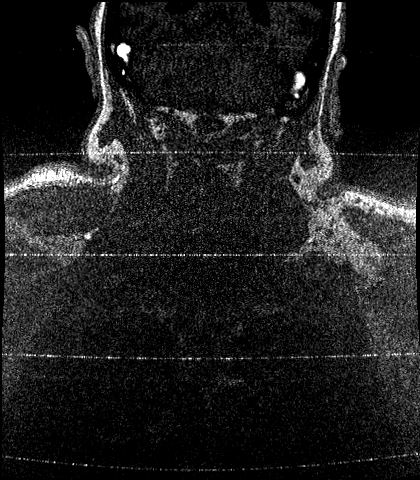
[im 10/80]
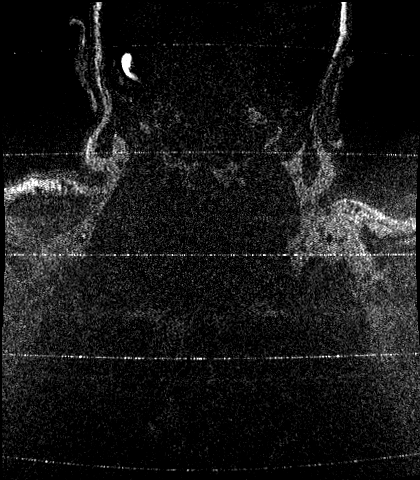
[im 20/80]
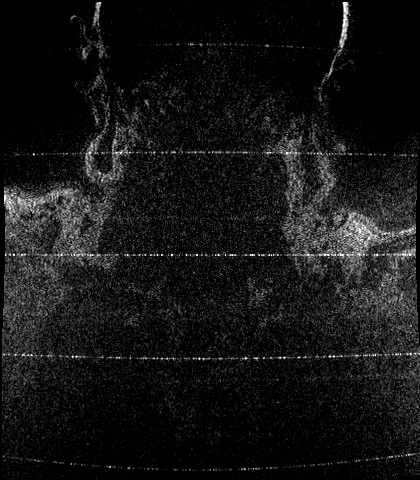
[im 30/80]
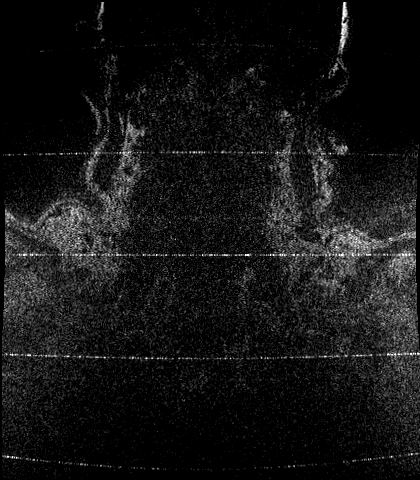
[im 40/80]
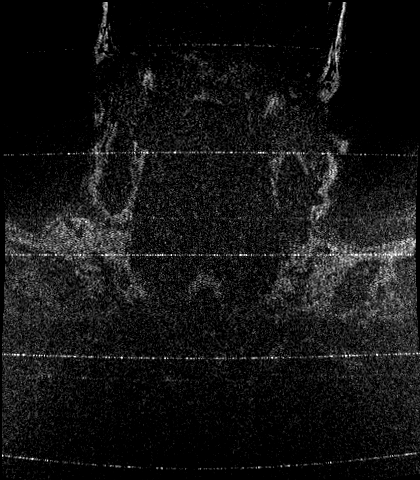
[im 50/80]
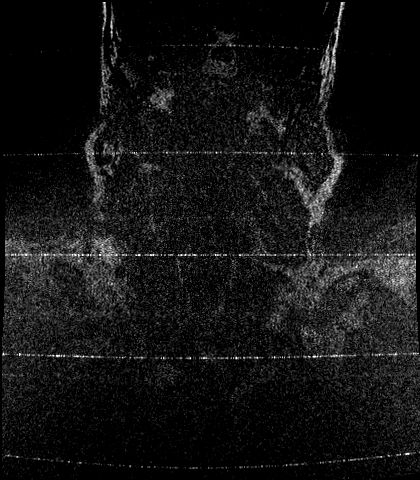
[im 60/80]
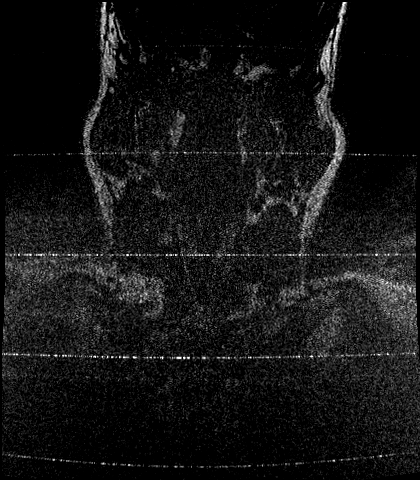
[im 70/80]
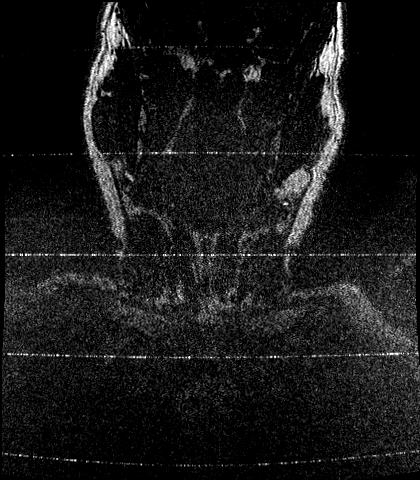
[im 80/80]
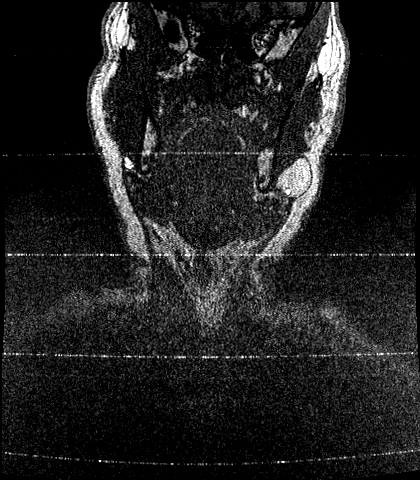

[Series 12: angio_fl3d_cor_highres_post_ttc=3.0s · coronal · B · 0.9mm · 0.62mm/px · 7 of 80 slices shown]
[im 1/80]
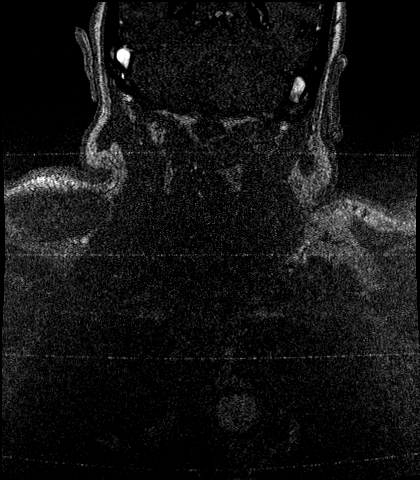
[im 12/80]
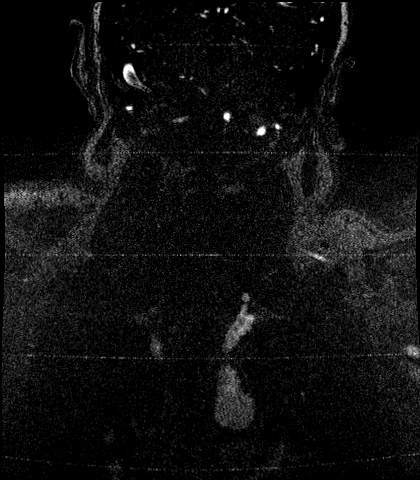
[im 23/80]
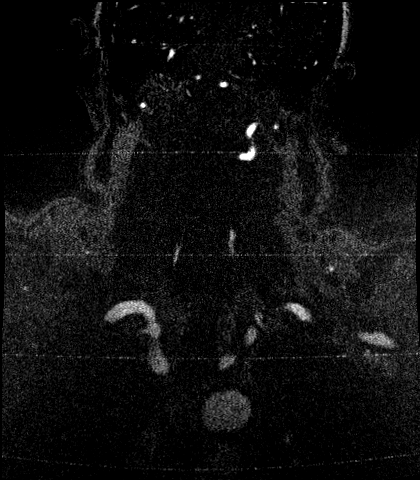
[im 34/80]
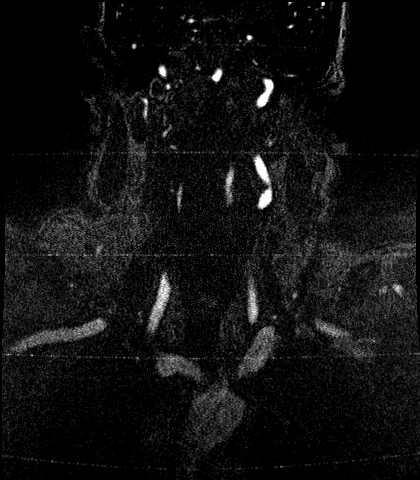
[im 46/80]
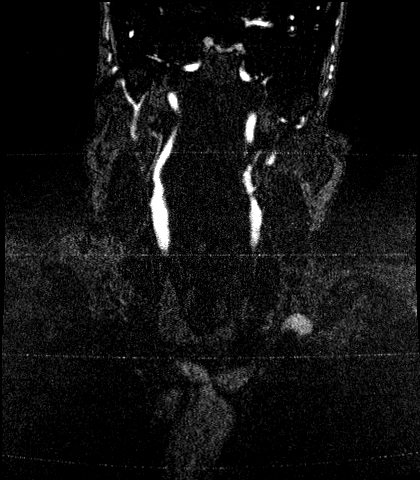
[im 57/80]
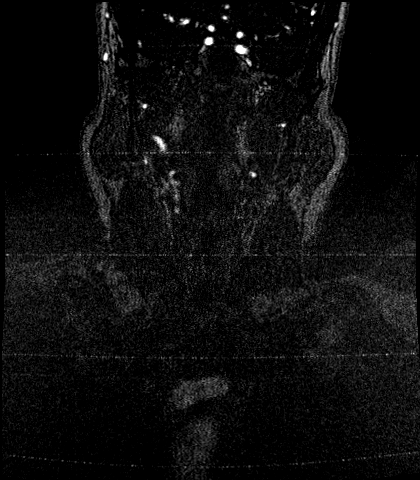
[im 68/80]
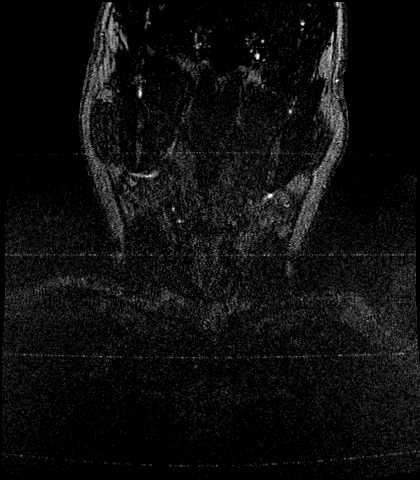

[Series 13: angio_fl3d_cor_highres_post_ttc=3.0s_moco-adv · coronal · B · 0.9mm · 0.62mm/px · 3 of 80 slices shown]
[im 12/80]
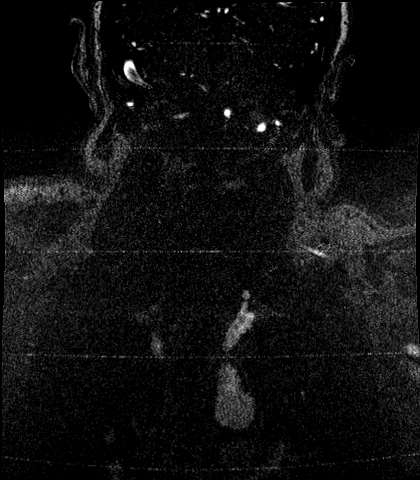
[im 46/80]
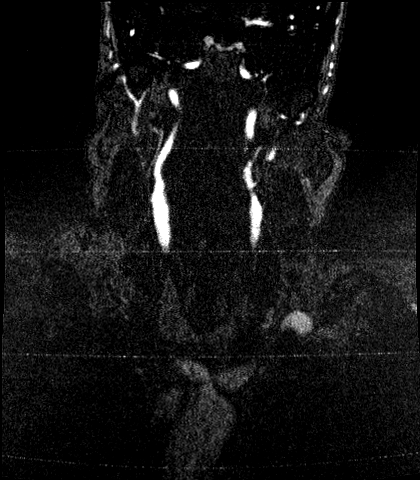
[im 68/80]
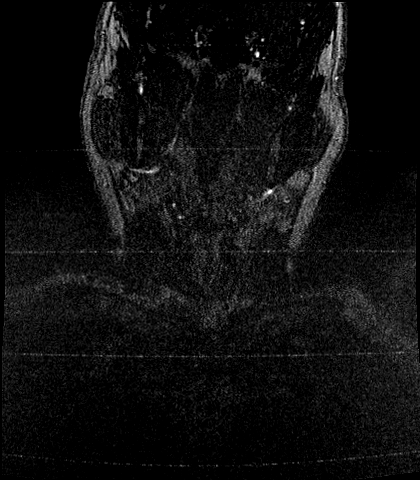

[28 of 48 positions shown; findings below may reference images not displayed]

FINDINGS: Vertebral arteries: There is diffuse tapering of the right vertebral
artery along its entire length, with enhancement on postcontrast
images. On time-of-flight images, there is no flow related
enhancement of the right vertebral artery. Left vertebral artery is
normal on both sequences.

Carotid arteries: There is atherosclerotic narrowing of the proximal
right internal carotid artery measuring less than 50% by NASCET
criteria. On the left there is atherosclerotic narrowing of the
proximal internal carotid artery that measures approximately 60 % by
NASCET criteria.
IMPRESSION: 1. Narrowing of the right vertebral artery along its entire length
on postcontrast imaging with no flow related enhancement on
time-of-flight imaging. Acuity remains uncertain.
2. Bilateral internal carotid artery atherosclerosis measuring less
than 50% on the right and 60% the left by NASCET criteria.

## 2019-08-06 IMAGING — MR MR MRA HEAD W/O CM
1 series · 25 of 48 positions shown · non-contrast
Comparison: Head CT [DATE]

CLINICAL DATA: Dizziness, nonspecific.

EXAM:
MRI HEAD WITHOUT CONTRAST
MRA HEAD WITHOUT CONTRAST
TECHNIQUE: Multiplanar, multiecho pulse sequences of the brain and surrounding
structures were obtained without intravenous contrast. Angiographic
images of the head were obtained using MRA technique without
contrast.

[Series 5: TOF · axial · 0.5mm · 0.48mm/px · z∈[+11,+113]mm · 25 of 232 slices shown]
[im 1/232]
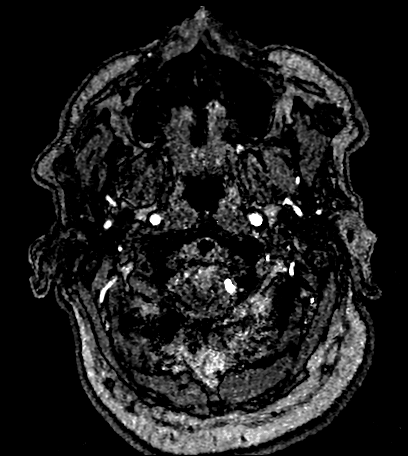
[im 5/232]
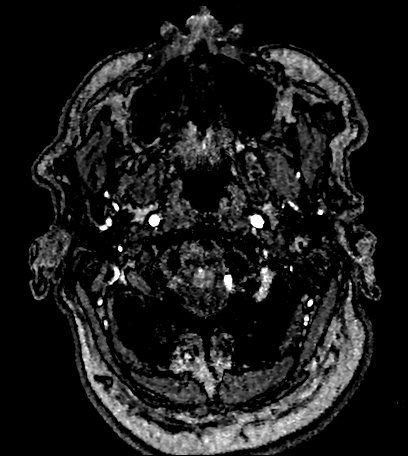
[im 10/232]
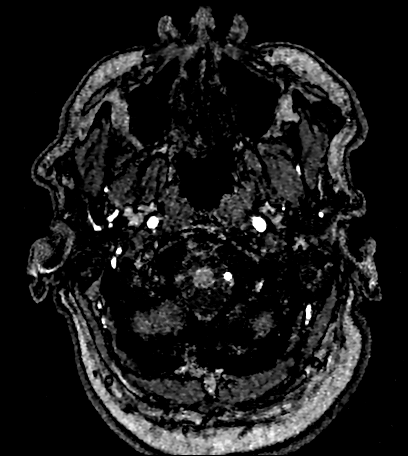
[im 15/232]
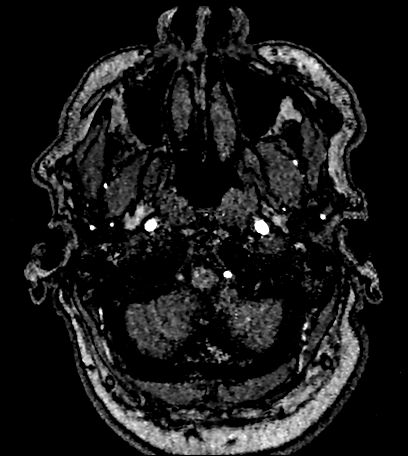
[im 20/232]
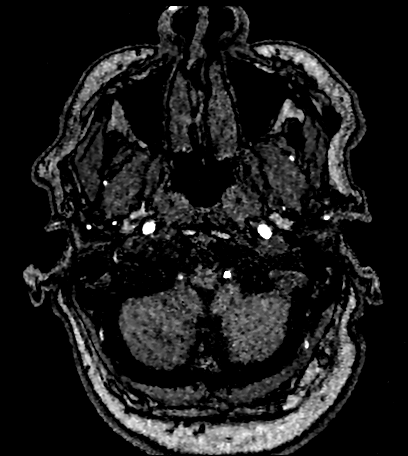
[im 25/232]
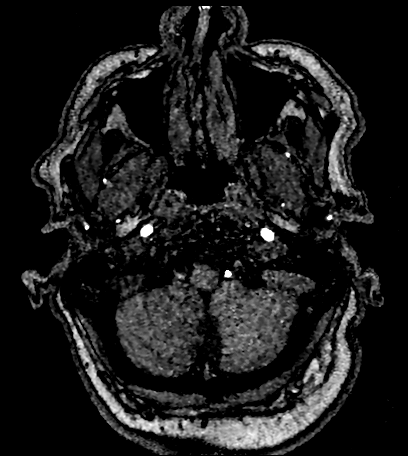
[im 30/232]
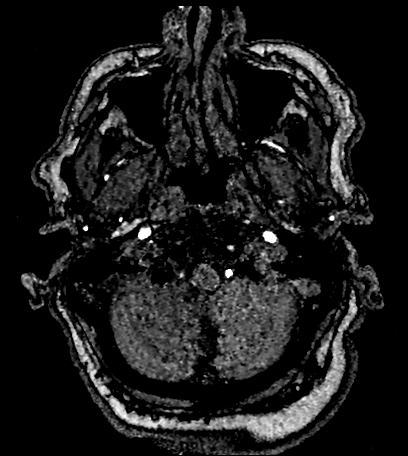
[im 35/232]
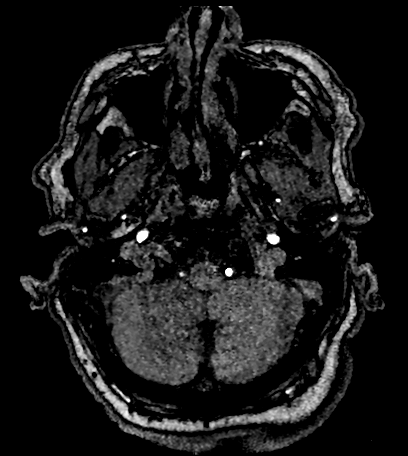
[im 40/232]
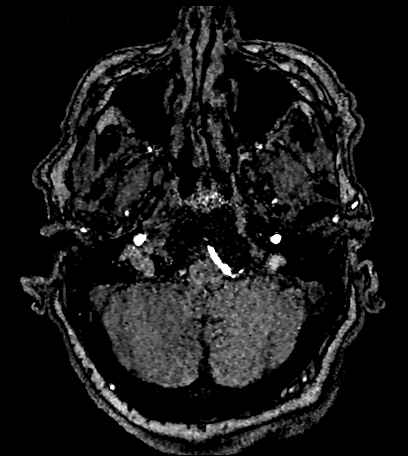
[im 45/232]
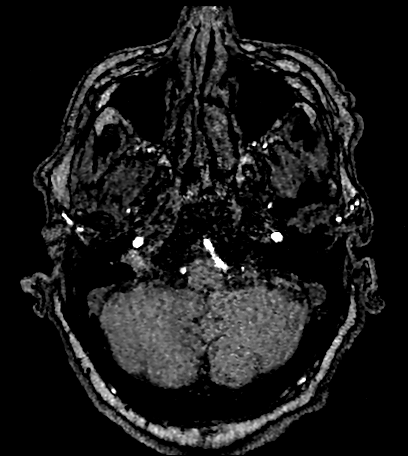
[im 50/232]
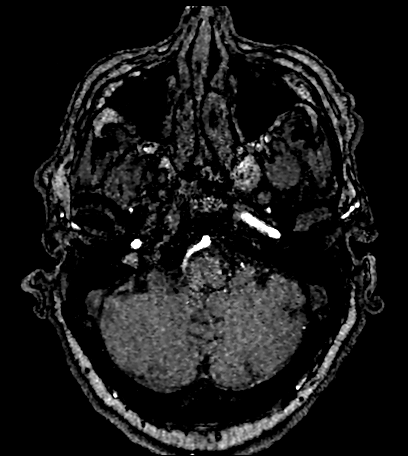
[im 55/232]
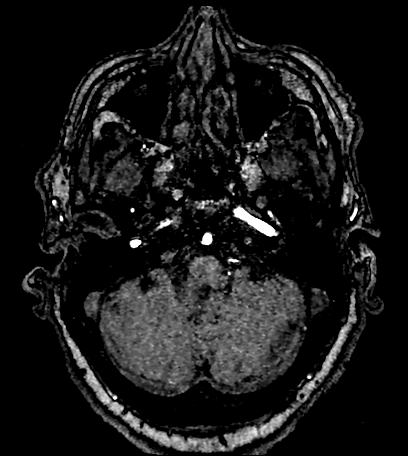
[im 59/232]
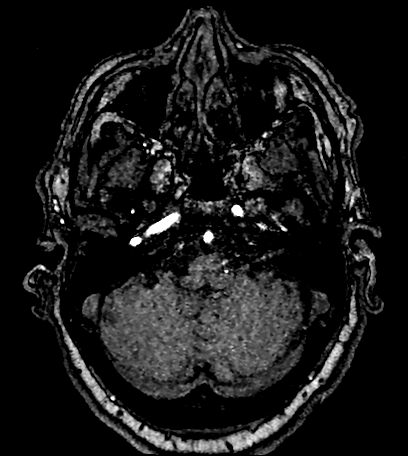
[im 64/232]
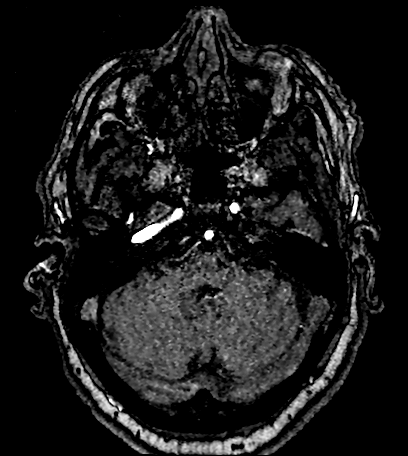
[im 69/232]
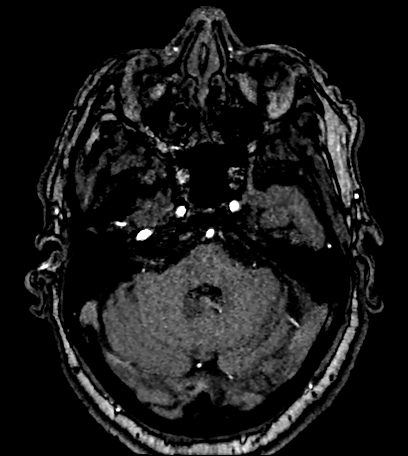
[im 74/232]
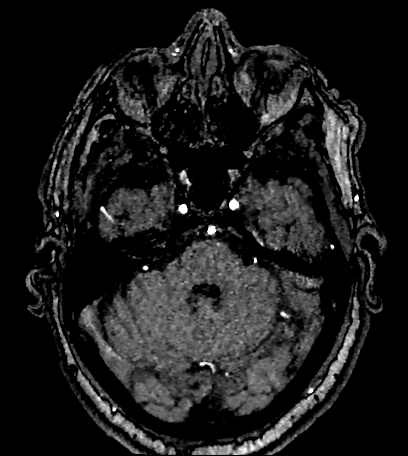
[im 79/232]
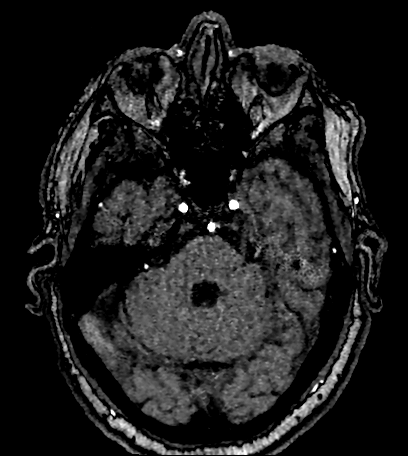
[im 84/232]
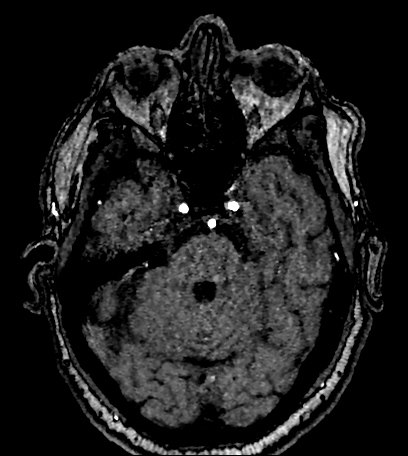
[im 104/232]
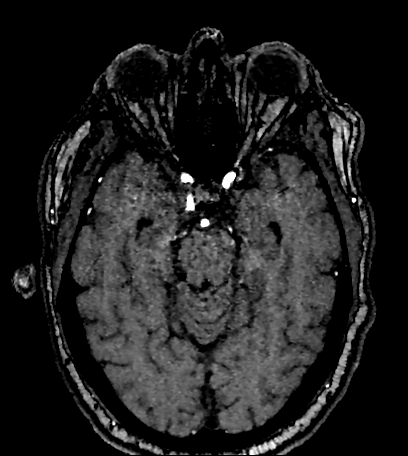
[im 118/232]
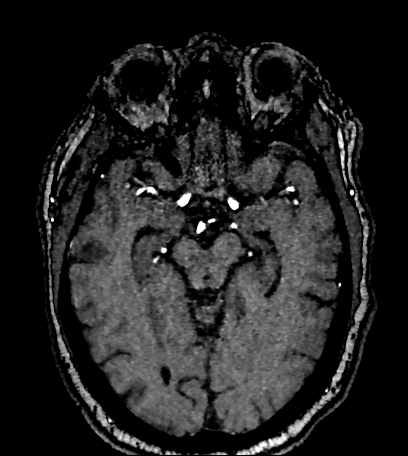
[im 133/232]
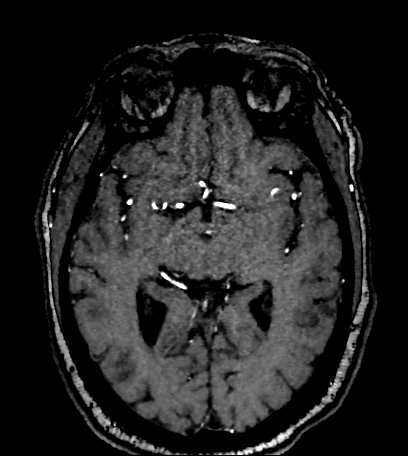
[im 163/232]
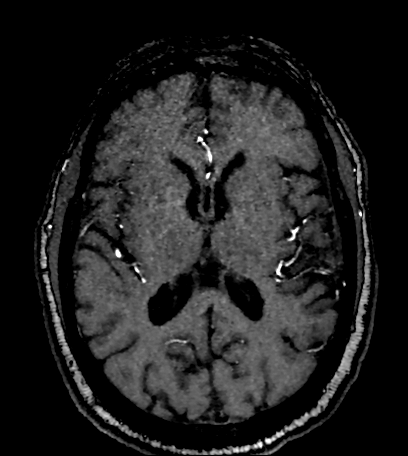
[im 192/232]
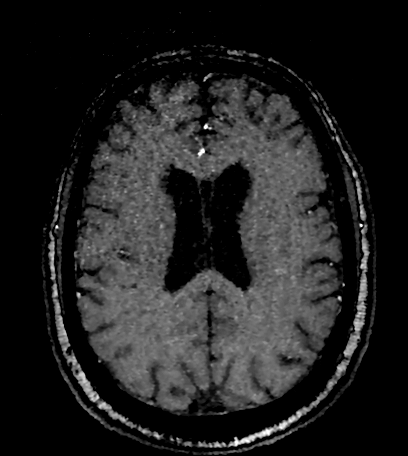
[im 197/232]
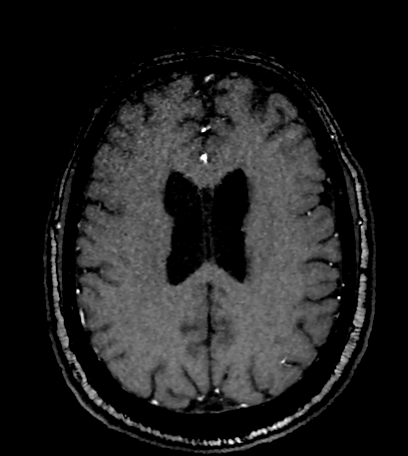
[im 222/232]
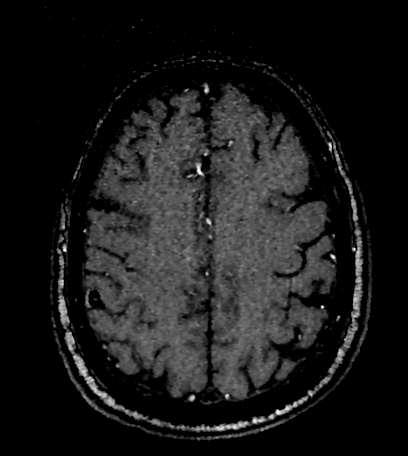

[25 of 48 positions shown; findings below may reference images not displayed]

FINDINGS: MRI HEAD FINDINGS

Brain:

Mild intermittent motion degradation.

Cerebral volume is normal for age.

There is a large region of restricted diffusion within the right
cerebellum within the right PICA territory consistent with acute
infarction. There is vague corresponding T2/FLAIR hyperintensity at
this site.

Mild scattered T2/FLAIR hyperintensity within the cerebral white
matter is nonspecific, but consistent with chronic small vessel
ischemic disease. There are small chronic thalamic lacunar infarcts
bilaterally. A small chronic infarct is also present within the
right cerebellum.

No evidence of intracranial mass.

No chronic intracranial blood products.

No extra-axial fluid collection.

No midline shift.

Incidentally noted cavum septum pellucidum and cavum vergae.

Vascular: Suspected right frontal lobe developmental venous anomaly.
Otherwise reported below.

Skull and upper cervical spine: No focal marrow lesion.

Sinuses/Orbits: Visualized orbits show no acute finding. Mild
ethmoid sinus mucosal thickening. Trace fluid within right mastoid
air cells.

MRA HEAD FINDINGS

The intracranial internal carotid arteries are patent.

The M1 middle cerebral arteries are patent without significant
stenosis. No M2 proximal branch occlusion or high-grade proximal
stenosis is identified. Multifocal mild-to-moderate atherosclerotic
irregularity of M2 and more distal MCA branch vessels bilaterally.

The anterior cerebral arteries are patent.

No intracranial aneurysm is identified.

No flow related signal is appreciated within the distal cervical
right vertebral artery. The proximal V4 right vertebral artery is
markedly diminutive and irregular in appearance suggestive of
high-grade stenosis and/or partial occlusion. The right PICA is
poorly delineated and likely occluded. The intracranial left
vertebral artery is patent without significant stenosis, as is the
basilar artery. The posterior cerebral arteries are patent
proximally without significant stenosis.

These results were called by telephone at the time of interpretation
on [DATE] at [DATE] to provider Dr. MARKOZ, Who verbally
acknowledged these results.
IMPRESSION: MRI brain:

1. Large acute infarct within the right cerebellar hemisphere within
the right PICA territory.
2. Mild chronic small vessel ischemic changes within the cerebral
white matter.
3. Chronic small-vessel infarcts within the thalami bilaterally and
right cerebellum.

MRA head:

Absence of appreciable flow-related signal within the distal right
cervical vertebral artery. Additionally, the proximal intracranial
right vertebral artery is markedly irregular and diminutive in
appearance suggestive of high-grade stenosis and/or partial
occlusion. The right PICA is poorly delineated and likely occluded.
CTA neck may be obtained for further characterization of these
findings and to assess for possible right vertebral artery
dissection.

## 2019-08-06 IMAGING — MR MR HEAD W/O CM
15 series · 45 of 48 positions shown · non-contrast
Comparison: Head CT [DATE]

CLINICAL DATA: Dizziness, nonspecific.

EXAM:
MRI HEAD WITHOUT CONTRAST
MRA HEAD WITHOUT CONTRAST
TECHNIQUE: Multiplanar, multiecho pulse sequences of the brain and surrounding
structures were obtained without intravenous contrast. Angiographic
images of the head were obtained using MRA technique without
contrast.

[Series 5: DWI · axial · 4.0mm · 0.90mm/px · z∈[+22,+153]mm · 4 of 34 slices shown (1 of 6)]
[im 1/34]
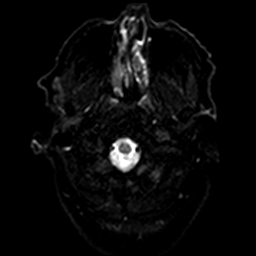
[im 12/34]
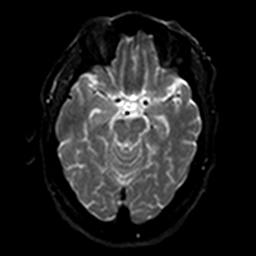
[im 23/34]
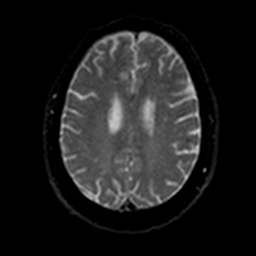
[im 34/34]
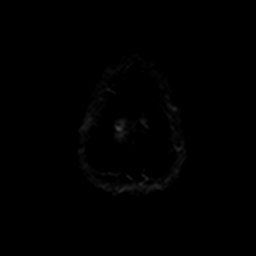

[Series 5: DWI · axial · 4.0mm · 0.90mm/px · z∈[+22,+153]mm · 3 of 34 slices shown (2 of 6)]
[im 1/34]
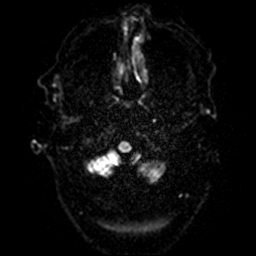
[im 17/34]
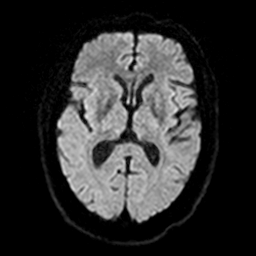
[im 34/34]
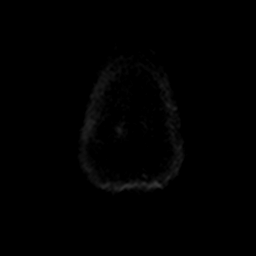

[Series 6: DWI · axial · 4.0mm · 0.90mm/px · z∈[+22,+153]mm · 3 of 34 slices shown (3 of 6)]
[im 1/34]
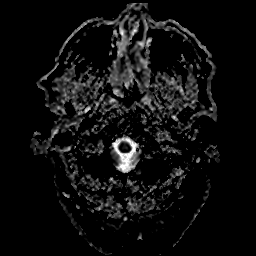
[im 17/34]
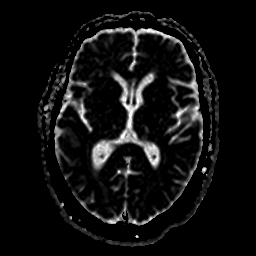
[im 34/34]
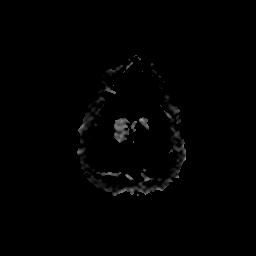

[Series 7: DWI · coronal · 4.0mm · 0.90mm/px · 3 of 35 slices shown (4 of 6)]
[im 1/35]
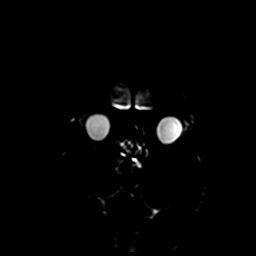
[im 18/35]
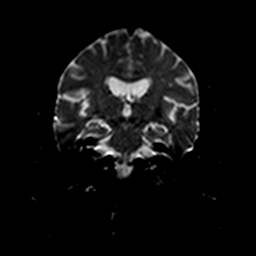
[im 35/35]
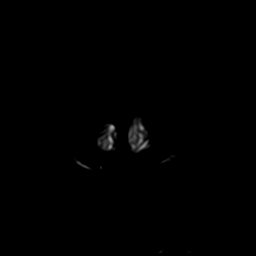

[Series 7: DWI · coronal · 4.0mm · 0.90mm/px · 3 of 35 slices shown (5 of 6)]
[im 1/35]
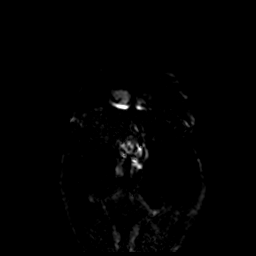
[im 18/35]
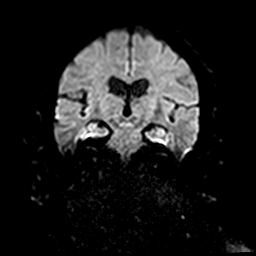
[im 35/35]
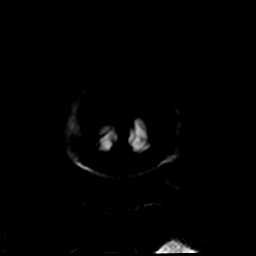

[Series 8: DWI · coronal · 4.0mm · 0.90mm/px · 3 of 35 slices shown (6 of 6)]
[im 1/35]
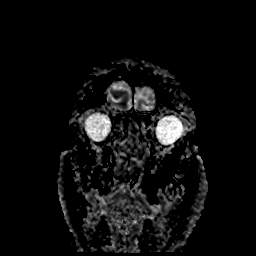
[im 18/35]
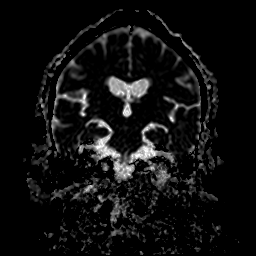
[im 35/35]
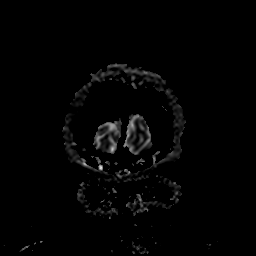

[Series 9: GRE · sagittal · 5.0mm · 0.90mm/px · 2 of 25 slices shown (1 of 2)]
[im 1/25]
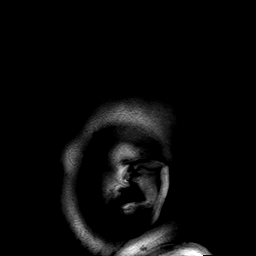
[im 25/25]
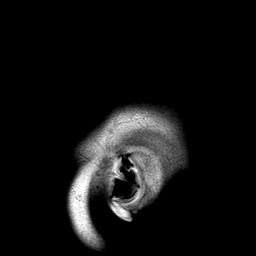

[Series 10: T2 · axial · 5.0mm · 0.43mm/px · z∈[+6,+160]mm · 2 of 25 slices shown]
[im 1/25]
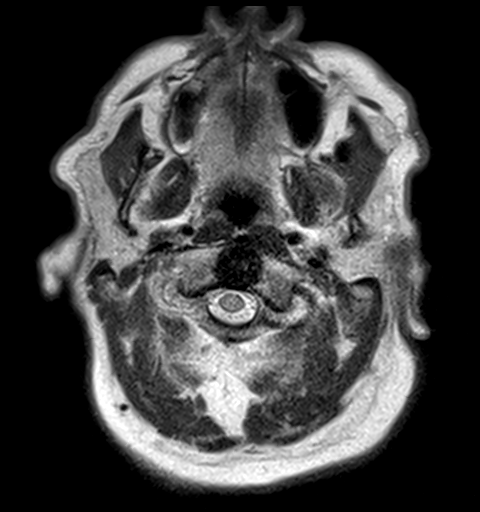
[im 25/25]
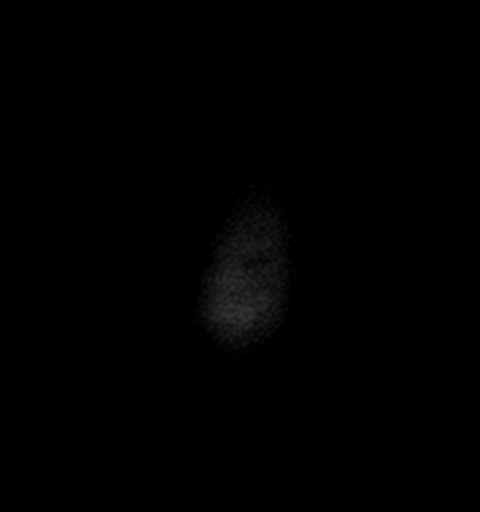

[Series 11: FLAIR · axial · 5.0mm · 0.43mm/px · z∈[+5,+159]mm · 2 of 25 slices shown]
[im 1/25]
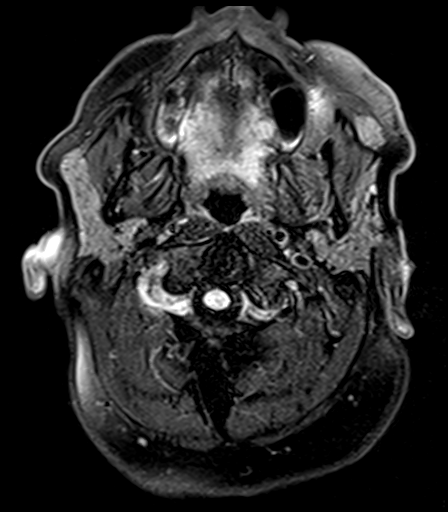
[im 25/25]
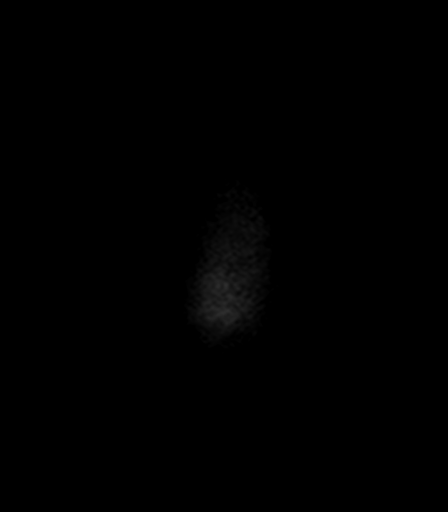

[Series 12: GRE · axial · 5.0mm · 0.43mm/px · z∈[+5,+159]mm · 2 of 25 slices shown (2 of 2)]
[im 1/25]
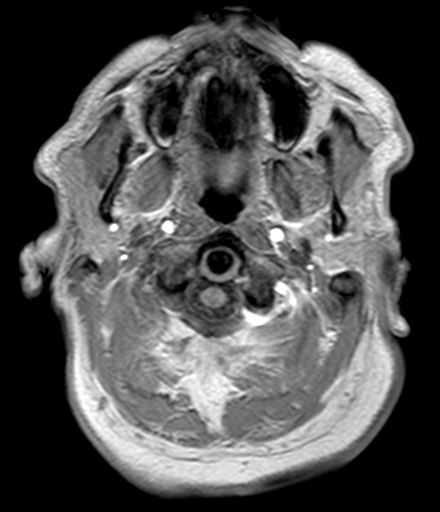
[im 25/25]
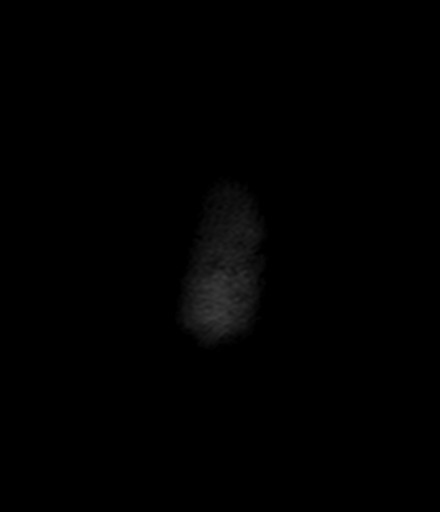

[Series 13: mag_images · axial · 3.0mm · 0.90mm/px · z∈[-2,+172]mm · 5 of 60 slices shown]
[im 1/60]
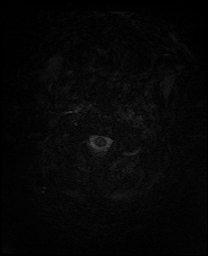
[im 15/60]
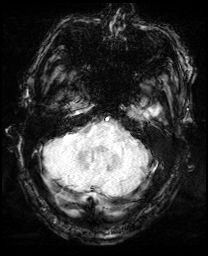
[im 30/60]
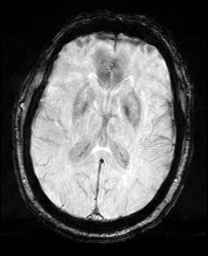
[im 45/60]
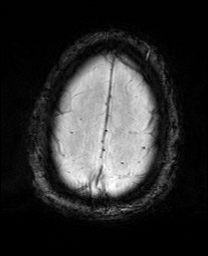
[im 60/60]
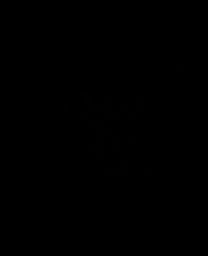

[Series 14: pha_images · axial · 3.0mm · 0.90mm/px · z∈[+1,+170]mm · 5 of 58 slices shown]
[im 1/58]
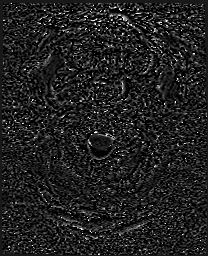
[im 15/58]
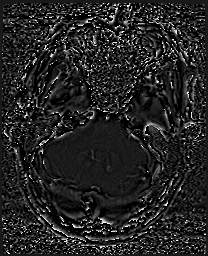
[im 29/58]
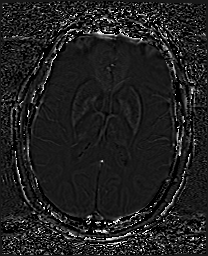
[im 43/58]
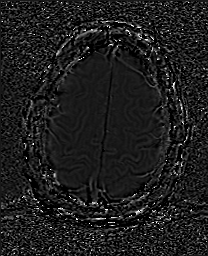
[im 58/58]
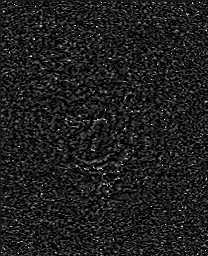

[Series 15: swi_images · axial · 3.0mm · 0.90mm/px · z∈[-2,+172]mm · 5 of 60 slices shown]
[im 1/60]
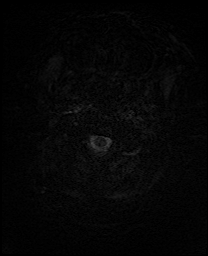
[im 15/60]
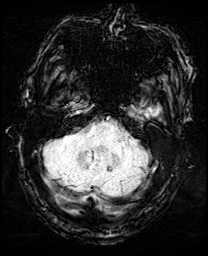
[im 30/60]
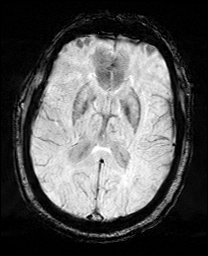
[im 45/60]
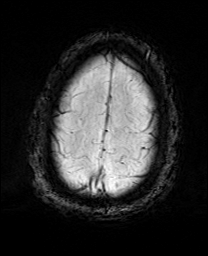
[im 60/60]
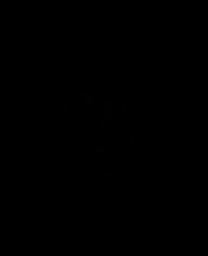

[Series 16: mip_images(sw) · axial · 24.0mm · 0.90mm/px · 1 of 53 slices shown]
[im 1/53]
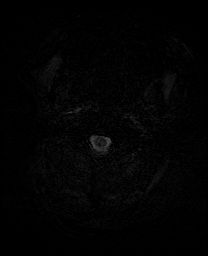

[Series 17: T2 post-contrast · coronal · 5.0mm · 0.45mm/px · 2 of 28 slices shown]
[im 1/28]
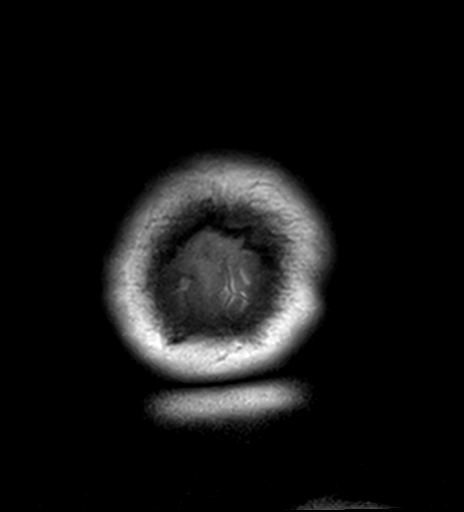
[im 28/28]
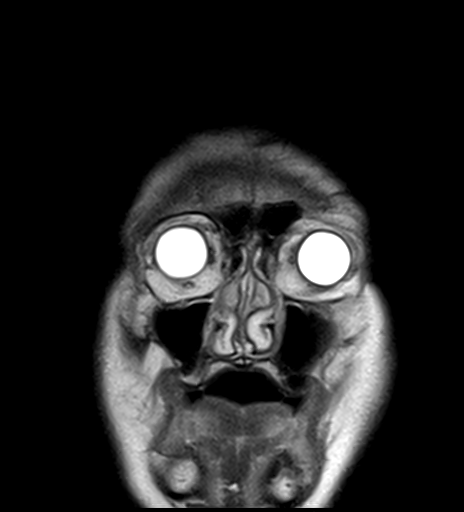

[45 of 48 positions shown; findings below may reference images not displayed]

FINDINGS: MRI HEAD FINDINGS

Brain:

Mild intermittent motion degradation.

Cerebral volume is normal for age.

There is a large region of restricted diffusion within the right
cerebellum within the right PICA territory consistent with acute
infarction. There is vague corresponding T2/FLAIR hyperintensity at
this site.

Mild scattered T2/FLAIR hyperintensity within the cerebral white
matter is nonspecific, but consistent with chronic small vessel
ischemic disease. There are small chronic thalamic lacunar infarcts
bilaterally. A small chronic infarct is also present within the
right cerebellum.

No evidence of intracranial mass.

No chronic intracranial blood products.

No extra-axial fluid collection.

No midline shift.

Incidentally noted cavum septum pellucidum and cavum vergae.

Vascular: Suspected right frontal lobe developmental venous anomaly.
Otherwise reported below.

Skull and upper cervical spine: No focal marrow lesion.

Sinuses/Orbits: Visualized orbits show no acute finding. Mild
ethmoid sinus mucosal thickening. Trace fluid within right mastoid
air cells.

MRA HEAD FINDINGS

The intracranial internal carotid arteries are patent.

The M1 middle cerebral arteries are patent without significant
stenosis. No M2 proximal branch occlusion or high-grade proximal
stenosis is identified. Multifocal mild-to-moderate atherosclerotic
irregularity of M2 and more distal MCA branch vessels bilaterally.

The anterior cerebral arteries are patent.

No intracranial aneurysm is identified.

No flow related signal is appreciated within the distal cervical
right vertebral artery. The proximal V4 right vertebral artery is
markedly diminutive and irregular in appearance suggestive of
high-grade stenosis and/or partial occlusion. The right PICA is
poorly delineated and likely occluded. The intracranial left
vertebral artery is patent without significant stenosis, as is the
basilar artery. The posterior cerebral arteries are patent
proximally without significant stenosis.

These results were called by telephone at the time of interpretation
on [DATE] at [DATE] to provider Dr. MARKOZ, Who verbally
acknowledged these results.
IMPRESSION: MRI brain:

1. Large acute infarct within the right cerebellar hemisphere within
the right PICA territory.
2. Mild chronic small vessel ischemic changes within the cerebral
white matter.
3. Chronic small-vessel infarcts within the thalami bilaterally and
right cerebellum.

MRA head:

Absence of appreciable flow-related signal within the distal right
cervical vertebral artery. Additionally, the proximal intracranial
right vertebral artery is markedly irregular and diminutive in
appearance suggestive of high-grade stenosis and/or partial
occlusion. The right PICA is poorly delineated and likely occluded.
CTA neck may be obtained for further characterization of these
findings and to assess for possible right vertebral artery
dissection.

## 2019-08-06 MED ORDER — SODIUM CHLORIDE 0.9 % IV BOLUS
1000.0000 mL | Freq: Once | INTRAVENOUS | Status: AC
  Administered 2019-08-06: 1000 mL via INTRAVENOUS

## 2019-08-06 MED ORDER — ONDANSETRON HCL 4 MG/2ML IJ SOLN
INTRAMUSCULAR | Status: AC
  Administered 2019-08-06: 4 mg via INTRAVENOUS
  Filled 2019-08-06: qty 2

## 2019-08-06 MED ORDER — HEPARIN SODIUM (PORCINE) 5000 UNIT/ML IJ SOLN
5000.0000 [IU] | Freq: Three times a day (TID) | INTRAMUSCULAR | Status: DC
  Administered 2019-08-07 – 2019-08-10 (×12): 5000 [IU] via SUBCUTANEOUS
  Filled 2019-08-06 (×12): qty 1

## 2019-08-06 MED ORDER — GADOBUTROL 1 MMOL/ML IV SOLN
10.0000 mL | Freq: Once | INTRAVENOUS | Status: AC | PRN
  Administered 2019-08-06: 10 mL via INTRAVENOUS

## 2019-08-06 MED ORDER — ONDANSETRON HCL 4 MG/2ML IJ SOLN: 4.0000 mg | Freq: Once | INTRAMUSCULAR | Status: AC

## 2019-08-06 MED ORDER — SODIUM CHLORIDE 0.9 % IV SOLN: INTRAVENOUS | Status: DC

## 2019-08-06 MED ORDER — ONDANSETRON HCL 4 MG/2ML IJ SOLN
4.0000 mg | Freq: Once | INTRAMUSCULAR | Status: AC
  Administered 2019-08-07: 4 mg via INTRAVENOUS
  Filled 2019-08-06: qty 2

## 2019-08-06 MED ORDER — STROKE: EARLY STAGES OF RECOVERY BOOK
Freq: Once | Status: DC
  Filled 2019-08-06: qty 1

## 2019-08-06 MED ORDER — LORAZEPAM 2 MG/ML IJ SOLN
1.0000 mg | Freq: Once | INTRAMUSCULAR | Status: AC
  Administered 2019-08-06: 1 mg via INTRAVENOUS
  Filled 2019-08-06: qty 1

## 2019-08-06 MED ORDER — ACETAMINOPHEN 160 MG/5ML PO SOLN: 650.0000 mg | ORAL | Status: DC | PRN

## 2019-08-06 MED ORDER — ASPIRIN 81 MG PO CHEW
324.0000 mg | CHEWABLE_TABLET | Freq: Once | ORAL | Status: AC
  Administered 2019-08-06: 324 mg via ORAL
  Filled 2019-08-06: qty 4

## 2019-08-06 MED ORDER — PRAVASTATIN SODIUM 20 MG PO TABS
20.0000 mg | ORAL_TABLET | Freq: Every day | ORAL | Status: DC
  Administered 2019-08-07: 20 mg via ORAL
  Filled 2019-08-06: qty 1
  Filled 2019-08-06: qty 2
  Filled 2019-08-06: qty 1

## 2019-08-06 MED ORDER — ACETAMINOPHEN 325 MG PO TABS
650.0000 mg | ORAL_TABLET | ORAL | Status: DC | PRN
  Administered 2019-08-07: 650 mg via ORAL
  Filled 2019-08-06: qty 2

## 2019-08-06 MED ORDER — ACETAMINOPHEN 650 MG RE SUPP: 650.0000 mg | RECTAL | Status: DC | PRN

## 2019-08-06 MED ORDER — ASPIRIN EC 81 MG PO TBEC
81.0000 mg | DELAYED_RELEASE_TABLET | Freq: Every day | ORAL | Status: DC
  Administered 2019-08-07: 81 mg via ORAL
  Filled 2019-08-06: qty 1

## 2019-08-06 NOTE — ED Provider Notes (Signed)
[facility] EMERGENCY DEPARTMENT Provider Note   CSN: [id number] Arrival date & time: 08/06/19  1217     History Chief Complaint  Patient presents with  . Dizziness    Gerald Taylor is a 64 y.o. male.  Patient is a 64 year old male with past medical history of hypertension, GERD, chronic renal insufficiency. He presents today for evaluation of dizziness. This woke him from sleep this morning. He describes a spinning sensation with associated nausea and vomiting. Patient states anytime he moves or tries to stand, his dizziness worsens and he vomits. He denies any headache or visual disturbances. He denies any numbness or tingling in his extremities.  The history is provided by the patient.  Dizziness Quality:  Room spinning Severity:  Severe Onset quality:  Sudden Timing:  Constant Progression:  Unchanged Chronicity:  New Context: head movement and standing up   Relieved by:  Nothing Worsened by:  Standing up, turning head, movement and sitting upright      Past Medical History:  Diagnosis Date  . Anemia    hx of anemia as a baby  . Chronic kidney disease    bladder stone   . GERD (gastroesophageal reflux disease)    hx of   . Hypertension   . Shortness of breath    with exertion     There are no problems to display for this patient.   Past Surgical History:  Procedure Laterality Date  . APPENDECTOMY    . HEMORRHOID SURGERY         Family History  Problem Relation Age of Onset  . CAD Mother   . CAD Father     Social History   Tobacco Use  . Smoking status: Current Every Day Smoker    Packs/day: 1.00    Years: 38.00    Pack years: 38.00    Types: Cigarettes  . Smokeless tobacco: Former Clinical biochemist  . Vaping Use: Never used  Substance Use Topics  . Alcohol use: Yes    Comment: occasional beer  . Drug use: No    Home Medications Prior to Admission medications   Medication Sig Start Date End Date Taking? Authorizing Provider  aspirin  EC 81 MG tablet Take 81 mg by mouth at bedtime.      [provider]  buPROPion (WELLBUTRIN XL) 150 MG 24 hr tablet daily. 02/15/17   [provider]  polyethylene glycol-electrolytes (TRILYTE) 420 g solution Take 4,000 mLs by mouth as directed. 03/21/17   Anice Paganini, NP  pravastatin (PRAVACHOL) 20 MG tablet Take 20 mg by mouth at bedtime.    [provider]    Allergies    Patient has no known allergies.  Review of Systems   Review of Systems  Neurological: Positive for dizziness.  All other systems reviewed and are negative.   Physical Exam Updated Vital Signs BP (!) 195/95 (BP Location: Left Arm)   Pulse 87   Temp 98.6 F (37 C) (Oral)   Resp 18   Ht 5\' 10"  (1.778 m)   Wt (!) 97.1 kg   SpO2 93%   BMI 30.71 kg/m   Physical Exam Vitals and nursing note reviewed.  Constitutional:      General: He is not in acute distress.    Appearance: He is well-developed. He is not diaphoretic.  HENT:     Head: Normocephalic and atraumatic.  Cardiovascular:     Rate and Rhythm: Normal rate and regular rhythm.  Heart sounds: No murmur heard.  No friction rub.  Pulmonary:     Effort: Pulmonary effort is normal. No respiratory distress.     Breath sounds: Normal breath sounds. No wheezing or rales.  Abdominal:     General: Bowel sounds are normal. There is no distension.     Palpations: Abdomen is soft.     Tenderness: There is no abdominal tenderness.  Musculoskeletal:        General: Normal range of motion.     Cervical back: Normal range of motion and neck supple.  Skin:    General: Skin is warm and dry.  Neurological:     General: No focal deficit present.     Mental Status: He is alert and oriented to person, place, and time.     Cranial Nerves: No cranial nerve deficit.     Motor: No weakness.     Coordination: Coordination normal.     Comments: Halpike is positive. Symptoms worsen with any change in position.     ED Results / Procedures /  Treatments   Labs (all labs ordered are listed, but only abnormal results are displayed) Labs Reviewed  COMPREHENSIVE METABOLIC PANEL  CBC  URINALYSIS, ROUTINE W REFLEX MICROSCOPIC    EKG EKG Interpretation  Date/Time:  Thursday August 06 2019 12:27:52 EDT Ventricular Rate:  80 PR Interval:    QRS Duration: 149 QT Interval:  409 QTC Calculation: 472 R Axis:   -26 Text Interpretation: Sinus rhythm Right bundle branch block Baseline wander in lead(s) V5 Confirmed by Geoffery Lyons ([id number]) on 08/06/2019 2:03:56 PM   Radiology No results found.  Procedures Procedures (including critical care time)  Medications Ordered in ED Medications  sodium chloride 0.9 % bolus 1,000 mL (has no administration in time range)  LORazepam (ATIVAN) injection 1 mg (has no administration in time range)  ondansetron (ZOFRAN) injection 4 mg (4 mg Intravenous Given 08/06/19 1322)    ED Course  I have reviewed the triage vital signs and the nursing notes.  Pertinent labs & imaging results that were available during my care of the patient were reviewed by me and considered in my medical decision making (see chart for details).    MDM Rules/Calculators/A&P  Patient presenting here with complaints of dizziness that is most likely related to peripheral vertigo.  Patient has worsening symptoms with turning his head and change in position and did get some relief with Ativan and IV fluids.  As the patient's symptoms persist, he will undergo MRI to rule out posterior circulation stroke.  Care to be signed out at shift change to Dr. Juleen China.  He will obtain the results of the MRI and determine the final disposition.  Final Clinical Impression(s) / ED Diagnoses Final diagnoses:  None    Rx / DC Orders ED Discharge Orders    None       Geoffery Lyons, MD 08/06/19 [phone]

## 2019-08-06 NOTE — ED Triage Notes (Signed)
EMS reports pt dizzy throughout the night and would wake up diaphoretic and nauseated.  Reports dizziness when he got up and vomits when he tries to move or sit up.  Pt c/o generalized weakness.  C/O headache

## 2019-08-06 NOTE — ED Provider Notes (Signed)
Assumed care in sign out with imaging pending. MRI/MRa with R cerebellar infarct in PICA territory. No flow in distal R cervical VA. Proximal R intracranial VA with appearance suggestive of high grade stenosis or partial occlusion.   Pt awakened by symptoms at 0730 this morning. Went to bed feeling fine at midnight. Discussed with Dr Laurence Slate, neurology. Not candidate for any acute interventions at this point. Since cerebellar infarct though should be admitted to [facility]. Follow exam. MRA neck. Will discuss with hsopitalist service. Pt/wife updated.    CT Head Wo Contrast  Result Date: 08/06/2019 CLINICAL DATA:  Dizziness EXAM: CT HEAD WITHOUT CONTRAST TECHNIQUE: Contiguous axial images were obtained from the base of the skull through the vertex without intravenous contrast. COMPARISON:  None. FINDINGS: Brain: There is no acute intracranial hemorrhage, mass effect, or edema. Gray-white differentiation is preserved. There is no extra-axial fluid collection. Ventricles and sulci are within normal limits in size and configuration. Minimal patchy hypoattenuation in the supratentorial white matter is nonspecific but may reflect minor chronic microvascular ischemic changes. Vascular: There is atherosclerotic calcification at the skull base. Skull: Calvarium is unremarkable. Sinuses/Orbits: No acute finding. Other: None. IMPRESSION: No acute intracranial abnormality. Electronically Signed   By: Guadlupe Spanish M.D.   On: 08/06/2019 14:26   MR ANGIO HEAD WO CONTRAST  Result Date: 08/06/2019 CLINICAL DATA:  Dizziness, nonspecific. EXAM: MRI HEAD WITHOUT CONTRAST MRA HEAD WITHOUT CONTRAST TECHNIQUE: Multiplanar, multiecho pulse sequences of the brain and surrounding structures were obtained without intravenous contrast. Angiographic images of the head were obtained using MRA technique without contrast. COMPARISON:  Head CT 08/06/2019 FINDINGS: MRI HEAD FINDINGS Brain: Mild intermittent motion degradation. Cerebral volume  is normal for age. There is a large region of restricted diffusion within the right cerebellum within the right PICA territory consistent with acute infarction. There is vague corresponding T2/FLAIR hyperintensity at this site. Mild scattered T2/FLAIR hyperintensity within the cerebral white matter is nonspecific, but consistent with chronic small vessel ischemic disease. There are small chronic thalamic lacunar infarcts bilaterally. A small chronic infarct is also present within the right cerebellum. No evidence of intracranial mass. No chronic intracranial blood products. No extra-axial fluid collection. No midline shift. Incidentally noted cavum septum pellucidum and cavum vergae. Vascular: Suspected right frontal lobe developmental venous anomaly. Otherwise reported below. Skull and upper cervical spine: No focal marrow lesion. Sinuses/Orbits: Visualized orbits show no acute finding. Mild ethmoid sinus mucosal thickening. Trace fluid within right mastoid air cells. MRA HEAD FINDINGS The intracranial internal carotid arteries are patent. The M1 middle cerebral arteries are patent without significant stenosis. No M2 proximal branch occlusion or high-grade proximal stenosis is identified. Multifocal mild-to-moderate atherosclerotic irregularity of M2 and more distal MCA branch vessels bilaterally. The anterior cerebral arteries are patent. No intracranial aneurysm is identified. No flow related signal is appreciated within the distal cervical right vertebral artery. The proximal V4 right vertebral artery is markedly diminutive and irregular in appearance suggestive of high-grade stenosis and/or partial occlusion. The right PICA is poorly delineated and likely occluded. The intracranial left vertebral artery is patent without significant stenosis, as is the basilar artery. The posterior cerebral arteries are patent proximally without significant stenosis. These results were called by telephone at the time of  interpretation on 08/06/2019 at 5:30 pm to provider Dr. Juleen China, Who verbally acknowledged these results. IMPRESSION: MRI brain: 1. Large acute infarct within the right cerebellar hemisphere within the right PICA territory. 2. Mild chronic small vessel ischemic changes within the cerebral white  matter. 3. Chronic small-vessel infarcts within the thalami bilaterally and right cerebellum. MRA head: Absence of appreciable flow-related signal within the distal right cervical vertebral artery. Additionally, the proximal intracranial right vertebral artery is markedly irregular and diminutive in appearance suggestive of high-grade stenosis and/or partial occlusion. The right PICA is poorly delineated and likely occluded. CTA neck may be obtained for further characterization of these findings and to assess for possible right vertebral artery dissection. Electronically Signed   By: Jackey Loge DO   On: 08/06/2019 17:32   MR BRAIN WO CONTRAST  Result Date: 08/06/2019 CLINICAL DATA:  Dizziness, nonspecific. EXAM: MRI HEAD WITHOUT CONTRAST MRA HEAD WITHOUT CONTRAST TECHNIQUE: Multiplanar, multiecho pulse sequences of the brain and surrounding structures were obtained without intravenous contrast. Angiographic images of the head were obtained using MRA technique without contrast. COMPARISON:  Head CT 08/06/2019 FINDINGS: MRI HEAD FINDINGS Brain: Mild intermittent motion degradation. Cerebral volume is normal for age. There is a large region of restricted diffusion within the right cerebellum within the right PICA territory consistent with acute infarction. There is vague corresponding T2/FLAIR hyperintensity at this site. Mild scattered T2/FLAIR hyperintensity within the cerebral white matter is nonspecific, but consistent with chronic small vessel ischemic disease. There are small chronic thalamic lacunar infarcts bilaterally. A small chronic infarct is also present within the right cerebellum. No evidence of intracranial  mass. No chronic intracranial blood products. No extra-axial fluid collection. No midline shift. Incidentally noted cavum septum pellucidum and cavum vergae. Vascular: Suspected right frontal lobe developmental venous anomaly. Otherwise reported below. Skull and upper cervical spine: No focal marrow lesion. Sinuses/Orbits: Visualized orbits show no acute finding. Mild ethmoid sinus mucosal thickening. Trace fluid within right mastoid air cells. MRA HEAD FINDINGS The intracranial internal carotid arteries are patent. The M1 middle cerebral arteries are patent without significant stenosis. No M2 proximal branch occlusion or high-grade proximal stenosis is identified. Multifocal mild-to-moderate atherosclerotic irregularity of M2 and more distal MCA branch vessels bilaterally. The anterior cerebral arteries are patent. No intracranial aneurysm is identified. No flow related signal is appreciated within the distal cervical right vertebral artery. The proximal V4 right vertebral artery is markedly diminutive and irregular in appearance suggestive of high-grade stenosis and/or partial occlusion. The right PICA is poorly delineated and likely occluded. The intracranial left vertebral artery is patent without significant stenosis, as is the basilar artery. The posterior cerebral arteries are patent proximally without significant stenosis. These results were called by telephone at the time of interpretation on 08/06/2019 at 5:30 pm to provider Dr. Juleen China, Who verbally acknowledged these results. IMPRESSION: MRI brain: 1. Large acute infarct within the right cerebellar hemisphere within the right PICA territory. 2. Mild chronic small vessel ischemic changes within the cerebral white matter. 3. Chronic small-vessel infarcts within the thalami bilaterally and right cerebellum. MRA head: Absence of appreciable flow-related signal within the distal right cervical vertebral artery. Additionally, the proximal intracranial right  vertebral artery is markedly irregular and diminutive in appearance suggestive of high-grade stenosis and/or partial occlusion. The right PICA is poorly delineated and likely occluded. CTA neck may be obtained for further characterization of these findings and to assess for possible right vertebral artery dissection. Electronically Signed   By: Jackey Loge DO   On: 08/06/2019 17:32      Raeford Razor, MD 08/06/19 1810

## 2019-08-06 NOTE — H&P (Signed)
TRH H&P   Patient Demographics:    Gerald Taylor, is a 64 y.o. male  MRN: [id number]   DOB - 1956-01-03  Admit Date - 08/06/2019  Outpatient Primary MD for the patient is Assunta Found, MD  Referring MD/NP/PA: Dr Juleen China  Patient coming from: Home  Chief Complaint  Patient presents with  . Dizziness      HPI:    Gerald Taylor  is a 64 y.o. male, with past medical history of hypertension, GERD, chronic renal insufficiency, patient presents to ED for evaluation of dizziness or vertigo, double vision, as well some nausea, no vomiting and mild headache, he reports his headache has resolved by now, but the symptoms were present upon him waking up from sleep this morning, reports vertigo exacerbated by him trying to move, he denies any tingling or numbness in his extremities, he denies any weakness or focal deficits in his extremities, he denies any such previous symptoms in the past. - in ED patient had MRI brain which was significant for right cerebellar infarct, as well MRA head was significant for poor vertebral circulation, patient received full dose aspirin in ED, ED physician discussed with nausea at [facility] who requested [place] to [facility] as patient high risk for edema given the size of infarct in the cerebral area, [facility] hospitalist were consulted to admit.    Review of systems:    In addition to the HPI above, No Fever-chills, she does report vertigo and headache No Headache, No changes with Vision or hearing, No problems swallowing food or Liquids, No Chest pain, Cough or Shortness of Breath, No Abdominal pain, does report nausea, but no Vommitting, Bowel movements are regular, No Blood in stool or Urine, No dysuria, No new skin rashes or bruises, No new joints pains-aches,  No new weakness, tingling, numbness in any extremity, No recent weight gain or loss, No  polyuria, polydypsia or polyphagia, No significant Mental Stressors.  A full 10 point Review of Systems was done, except as stated above, all other Review of Systems were negative.   With Past History of the following :    Past Medical History:  Diagnosis Date  . Anemia    hx of anemia as a baby  . Chronic kidney disease    bladder stone   . GERD (gastroesophageal reflux disease)    hx of   . Hypertension   . Shortness of breath    with exertion       Past Surgical History:  Procedure Laterality Date  . APPENDECTOMY    . HEMORRHOID SURGERY        Social History:     Social History   Tobacco Use  . Smoking status: Current Every Day Smoker    Packs/day: 1.00    Years: 38.00    Pack years: 38.00    Types: Cigarettes  . Smokeless tobacco: Former Neurosurgeon  Substance Use Topics  . Alcohol use: Yes    Comment: occasional beer      Family History :     Family History  Problem Relation Age of Onset  . CAD Mother   . CAD Father       Home Medications:   Prior to Admission medications   Medication Sig Start Date End Date Taking? Authorizing Provider  acetaminophen (TYLENOL) 500 MG tablet Take 500 mg by mouth every 6 (six) hours as needed.   Yes [provider]  aspirin EC 81 MG tablet Take 81 mg by mouth at bedtime.     Yes [provider]  lisinopril (ZESTRIL) 20 MG tablet Take 20 mg by mouth daily. 07/08/19  Yes [provider]  Omega-3 Fatty Acids (FISH OIL) 1000 MG CAPS Take 1 capsule by mouth daily.   Yes [provider]  buPROPion (WELLBUTRIN XL) 150 MG 24 hr tablet daily. Patient not taking: Reported on 08/06/2019 02/15/17   [provider]  polyethylene glycol-electrolytes (TRILYTE) 420 g solution Take 4,000 mLs by mouth as directed. Patient not taking: Reported on 08/06/2019 03/21/17   Anice Paganini, NP  pravastatin (PRAVACHOL) 20 MG tablet Take 20 mg by mouth at bedtime. Patient not taking: Reported on 08/06/2019     [provider]     Allergies:    No Known Allergies   Physical Exam:   Vitals  Blood pressure (!) 153/87, pulse 87, temperature 98.6 F (37 C), temperature source Oral, resp. rate 15, height 5\' 10"  (1.778 m), weight (!) 97.1 kg, SpO2 96 %.   1. General well-developed male, laying in bed, mildly uncomfortable  2. Normal affect and insight, Not Suicidal or Homicidal, Awake Alert, Oriented X 3.  3. No F.N deficits,  Strength 5/5 all 4 extremities, Sensation intact all 4 extremities, Plantars down going.  4. Ears and Eyes appear Normal, Conjunctivae clear, PERRLA. Moist Oral Mucosa.  5. Supple Neck, No JVD, No cervical lymphadenopathy appriciated, No Carotid Bruits.  6. Symmetrical Chest wall movement, Good air movement bilaterally, CTAB.  7. RRR, No Gallops, Rubs or Murmurs, No Parasternal Heave.  8. Positive Bowel Sounds, Abdomen Soft, No tenderness, No organomegaly appriciated,No rebound -guarding or rigidity.  9.  No Cyanosis, Normal Skin Turgor, No Skin Rash or Bruise.  10. Good muscle tone,  joints appear normal , no effusions, Normal ROM.  11. No Palpable Lymph Nodes in Neck or Axillae     Data Review:    CBC Recent Labs  Lab 08/06/19 1322  WBC 13.2*  HGB 15.4  HCT 46.7  PLT 290  MCV 92.7  MCH 30.6  MCHC 33.0  RDW 14.2   ------------------------------------------------------------------------------------------------------------------  Chemistries  Recent Labs  Lab 08/06/19 1322  NA 138  K 3.8  CL 106  CO2 20*  GLUCOSE 145*  BUN 9  CREATININE 0.84  CALCIUM 8.9  AST 23  ALT 24  ALKPHOS 52  BILITOT 0.4   ------------------------------------------------------------------------------------------------------------------ estimated creatinine clearance is 105.2 mL/min (by C-G formula based on SCr of 0.84 mg/dL). ------------------------------------------------------------------------------------------------------------------ No results  for input(s): TSH, T4TOTAL, T3FREE, THYROIDAB in the last 72 hours.  Invalid input(s): FREET3  Coagulation profile No results for input(s): INR, PROTIME in the last 168 hours. ------------------------------------------------------------------------------------------------------------------- No results for input(s): DDIMER in the last 72 hours. -------------------------------------------------------------------------------------------------------------------  Cardiac Enzymes No results for input(s): CKMB, TROPONINI, MYOGLOBIN in the last 168 hours.  Invalid input(s): CK ------------------------------------------------------------------------------------------------------------------ No results found for: BNP   ---------------------------------------------------------------------------------------------------------------  Urinalysis No results found for: COLORURINE, APPEARANCEUR, LABSPEC, PHURINE, GLUCOSEU, HGBUR, BILIRUBINUR, KETONESUR, PROTEINUR, UROBILINOGEN, NITRITE, LEUKOCYTESUR  ----------------------------------------------------------------------------------------------------------------   Imaging Results:    CT Head Wo Contrast  Result Date: 08/06/2019 CLINICAL DATA:  Dizziness EXAM: CT HEAD WITHOUT CONTRAST TECHNIQUE: Contiguous axial images were obtained from the base of the skull through the vertex without intravenous contrast. COMPARISON:  None. FINDINGS: Brain: There is no acute intracranial hemorrhage, mass effect, or edema. Gray-white differentiation is preserved. There is no extra-axial fluid collection. Ventricles and sulci are within normal limits in size and configuration. Minimal patchy hypoattenuation in the supratentorial white matter is nonspecific but may reflect minor chronic microvascular ischemic changes. Vascular: There is atherosclerotic calcification at the skull base. Skull: Calvarium is unremarkable. Sinuses/Orbits: No acute finding. Other: None.  IMPRESSION: No acute intracranial abnormality. Electronically Signed   By: Guadlupe Spanish M.D.   On: 08/06/2019 14:26   MR ANGIO HEAD WO CONTRAST  Result Date: 08/06/2019 CLINICAL DATA:  Dizziness, nonspecific. EXAM: MRI HEAD WITHOUT CONTRAST MRA HEAD WITHOUT CONTRAST TECHNIQUE: Multiplanar, multiecho pulse sequences of the brain and surrounding structures were obtained without intravenous contrast. Angiographic images of the head were obtained using MRA technique without contrast. COMPARISON:  Head CT 08/06/2019 FINDINGS: MRI HEAD FINDINGS Brain: Mild intermittent motion degradation. Cerebral volume is normal for age. There is a large region of restricted diffusion within the right cerebellum within the right PICA territory consistent with acute infarction. There is vague corresponding T2/FLAIR hyperintensity at this site. Mild scattered T2/FLAIR hyperintensity within the cerebral white matter is nonspecific, but consistent with chronic small vessel ischemic disease. There are small chronic thalamic lacunar infarcts bilaterally. A small chronic infarct is also present within the right cerebellum. No evidence of intracranial mass. No chronic intracranial blood products. No extra-axial fluid collection. No midline shift. Incidentally noted cavum septum pellucidum and cavum vergae. Vascular: Suspected right frontal lobe developmental venous anomaly. Otherwise reported below. Skull and upper cervical spine: No focal marrow lesion. Sinuses/Orbits: Visualized orbits show no acute finding. Mild ethmoid sinus mucosal thickening. Trace fluid within right mastoid air cells. MRA HEAD FINDINGS The intracranial internal carotid arteries are patent. The M1 middle cerebral arteries are patent without significant stenosis. No M2 proximal branch occlusion or high-grade proximal stenosis is identified. Multifocal mild-to-moderate atherosclerotic irregularity of M2 and more distal MCA branch vessels bilaterally. The anterior  cerebral arteries are patent. No intracranial aneurysm is identified. No flow related signal is appreciated within the distal cervical right vertebral artery. The proximal V4 right vertebral artery is markedly diminutive and irregular in appearance suggestive of high-grade stenosis and/or partial occlusion. The right PICA is poorly delineated and likely occluded. The intracranial left vertebral artery is patent without significant stenosis, as is the basilar artery. The posterior cerebral arteries are patent proximally without significant stenosis. These results were called by telephone at the time of interpretation on 08/06/2019 at 5:30 pm to provider Dr. Juleen China, Who verbally acknowledged these results. IMPRESSION: MRI brain: 1. Large acute infarct within the right cerebellar hemisphere within the right PICA territory. 2. Mild chronic small vessel ischemic changes within the cerebral white matter. 3. Chronic small-vessel infarcts within the thalami bilaterally and right cerebellum. MRA head: Absence of appreciable flow-related signal within the distal right cervical vertebral artery. Additionally, the proximal intracranial right vertebral artery is markedly irregular and diminutive in appearance suggestive of high-grade stenosis and/or partial occlusion. The right PICA is poorly delineated and likely occluded. CTA neck may be obtained for further characterization of  these findings and to assess for possible right vertebral artery dissection. Electronically Signed   By: Jackey LogeKyle  Golden DO   On: 08/06/2019 17:32   MR BRAIN WO CONTRAST  Result Date: 08/06/2019 CLINICAL DATA:  Dizziness, nonspecific. EXAM: MRI HEAD WITHOUT CONTRAST MRA HEAD WITHOUT CONTRAST TECHNIQUE: Multiplanar, multiecho pulse sequences of the brain and surrounding structures were obtained without intravenous contrast. Angiographic images of the head were obtained using MRA technique without contrast. COMPARISON:  Head CT 08/06/2019 FINDINGS: MRI  HEAD FINDINGS Brain: Mild intermittent motion degradation. Cerebral volume is normal for age. There is a large region of restricted diffusion within the right cerebellum within the right PICA territory consistent with acute infarction. There is vague corresponding T2/FLAIR hyperintensity at this site. Mild scattered T2/FLAIR hyperintensity within the cerebral white matter is nonspecific, but consistent with chronic small vessel ischemic disease. There are small chronic thalamic lacunar infarcts bilaterally. A small chronic infarct is also present within the right cerebellum. No evidence of intracranial mass. No chronic intracranial blood products. No extra-axial fluid collection. No midline shift. Incidentally noted cavum septum pellucidum and cavum vergae. Vascular: Suspected right frontal lobe developmental venous anomaly. Otherwise reported below. Skull and upper cervical spine: No focal marrow lesion. Sinuses/Orbits: Visualized orbits show no acute finding. Mild ethmoid sinus mucosal thickening. Trace fluid within right mastoid air cells. MRA HEAD FINDINGS The intracranial internal carotid arteries are patent. The M1 middle cerebral arteries are patent without significant stenosis. No M2 proximal branch occlusion or high-grade proximal stenosis is identified. Multifocal mild-to-moderate atherosclerotic irregularity of M2 and more distal MCA branch vessels bilaterally. The anterior cerebral arteries are patent. No intracranial aneurysm is identified. No flow related signal is appreciated within the distal cervical right vertebral artery. The proximal V4 right vertebral artery is markedly diminutive and irregular in appearance suggestive of high-grade stenosis and/or partial occlusion. The right PICA is poorly delineated and likely occluded. The intracranial left vertebral artery is patent without significant stenosis, as is the basilar artery. The posterior cerebral arteries are patent proximally without  significant stenosis. These results were called by telephone at the time of interpretation on 08/06/2019 at 5:30 pm to provider Dr. Juleen ChinaKohut, Who verbally acknowledged these results. IMPRESSION: MRI brain: 1. Large acute infarct within the right cerebellar hemisphere within the right PICA territory. 2. Mild chronic small vessel ischemic changes within the cerebral white matter. 3. Chronic small-vessel infarcts within the thalami bilaterally and right cerebellum. MRA head: Absence of appreciable flow-related signal within the distal right cervical vertebral artery. Additionally, the proximal intracranial right vertebral artery is markedly irregular and diminutive in appearance suggestive of high-grade stenosis and/or partial occlusion. The right PICA is poorly delineated and likely occluded. CTA neck may be obtained for further characterization of these findings and to assess for possible right vertebral artery dissection. Electronically Signed   By: Jackey LogeKyle  Golden DO   On: 08/06/2019 17:32    My personal review of EKG: Rhythm NSR, Rate  80 /min, QTc 470,RBBB   Assessment & Plan:    Active Problems:   Acute CVA (cerebrovascular accident) (HCC)   Acute CVA - MRI brain significant for  right cerebral infarct and PICA territory. - MRA head with Absence of appreciable flow-related signal within the distal right cervical vertebral artery. Additionally, the proximal intracranial right vertebral artery is markedly irregular and diminutive in appearance suggestive of high-grade stenosis and/or partial occlusion.  - MRA neck is pending,  -Patient was admitted under acute CVA pathway, 2D echo is pending,  lipid panel, hemoglobin A1c. -Allow for permissive hypertension. -Patient received full dose aspirin in ED -2D echo, continue with telemetry monitoring. -On subcu heparin for DVT prophylaxis. -Patient reports his nausea and vertigo slightly improved, he denies any headache currently, I have informed him to inform  us if his headache or nausea is worsening. -Patient will be transferred to [facility] for further evaluation by stroke team.  Hypertension -Hold home medication and allow for permissive hypertension.  Tobacco abuse -He was counseled, will start on nicotine patch   DVT Prophylaxis Heparin - SCDs   AM Labs Ordered, also please review Full Orders  Family Communication: Admission, patients condition and plan of care including tests being ordered have been discussed with the patient and wife at bedside who indicate understanding and agree with the plan and Code Status.  Code Status Full  Likely DC to Home  Condition GUARDED   Consults called: Neurology at [facility]  Admission status: Inpatient  Time spent in minutes : 60 minutes   Huey Bienenstock M.D on 08/06/2019 at 7:21 PM   [facility] - Office  [phone]

## 2019-08-06 NOTE — ED Notes (Signed)
Pt in MRI at this time 

## 2019-08-06 NOTE — ED Notes (Signed)
Pt taken to ct at this time.

## 2019-08-07 ENCOUNTER — Inpatient Hospital Stay (HOSPITAL_COMMUNITY): Payer: Self-pay

## 2019-08-07 ENCOUNTER — Other Ambulatory Visit: Payer: Self-pay

## 2019-08-07 DIAGNOSIS — I350 Nonrheumatic aortic (valve) stenosis: Secondary | ICD-10-CM

## 2019-08-07 DIAGNOSIS — I639 Cerebral infarction, unspecified: Principal | ICD-10-CM

## 2019-08-07 DIAGNOSIS — D72829 Elevated white blood cell count, unspecified: Secondary | ICD-10-CM

## 2019-08-07 LAB — URINALYSIS, ROUTINE W REFLEX MICROSCOPIC
Bacteria, UA: NONE SEEN
Bilirubin Urine: NEGATIVE
Glucose, UA: NEGATIVE mg/dL
Ketones, ur: NEGATIVE mg/dL
Leukocytes,Ua: NEGATIVE
Nitrite: NEGATIVE
Protein, ur: NEGATIVE mg/dL
Specific Gravity, Urine: 1.019 (ref 1.005–1.030)
pH: 6 (ref 5.0–8.0)

## 2019-08-07 LAB — ECHOCARDIOGRAM COMPLETE
AR max vel: 1.67 cm2
AV Area VTI: 1.85 cm2
AV Area mean vel: 1.6 cm2
AV Mean grad: 12 mmHg
AV Peak grad: 22.3 mmHg
Ao pk vel: 2.36 m/s
Area-P 1/2: 2.87 cm2
Height: 70 in
S' Lateral: 3.2 cm
Weight: 3424 oz

## 2019-08-07 LAB — HIV ANTIBODY (ROUTINE TESTING W REFLEX): HIV Screen 4th Generation wRfx: NONREACTIVE

## 2019-08-07 MED ORDER — ONDANSETRON HCL 4 MG/2ML IJ SOLN
4.0000 mg | Freq: Four times a day (QID) | INTRAMUSCULAR | Status: DC | PRN
  Administered 2019-08-07 – 2019-08-09 (×4): 4 mg via INTRAVENOUS
  Filled 2019-08-07 (×4): qty 2

## 2019-08-07 MED ORDER — PRAVASTATIN SODIUM 10 MG PO TABS
20.0000 mg | ORAL_TABLET | Freq: Every day | ORAL | Status: DC
  Administered 2019-08-07: 20 mg via ORAL
  Filled 2019-08-07: qty 2

## 2019-08-07 MED ORDER — CLOPIDOGREL BISULFATE 75 MG PO TABS
75.0000 mg | ORAL_TABLET | Freq: Every day | ORAL | Status: DC
  Administered 2019-08-08 – 2019-08-10 (×3): 75 mg via ORAL
  Filled 2019-08-07 (×3): qty 1

## 2019-08-07 MED ORDER — NICOTINE 14 MG/24HR TD PT24
14.0000 mg | MEDICATED_PATCH | Freq: Every day | TRANSDERMAL | Status: DC
  Administered 2019-08-08 – 2019-08-10 (×3): 14 mg via TRANSDERMAL
  Filled 2019-08-07 (×4): qty 1

## 2019-08-07 MED ORDER — CLOPIDOGREL BISULFATE 75 MG PO TABS
300.0000 mg | ORAL_TABLET | Freq: Once | ORAL | Status: AC
  Administered 2019-08-07: 300 mg via ORAL
  Filled 2019-08-07: qty 4

## 2019-08-07 NOTE — Progress Notes (Signed)
STROKE TEAM PROGRESS NOTE   INTERVAL HISTORY His wife is at the bedside.  I have personally reviewed history of present illness with the patient, electronic medical records and imaging films in PACS.  He woke up on Thursday with dizziness and gait ataxia and MRI scan shows right cerebellar infarct with diffuse narrowing of the right vertebral artery throughout on MR angiogram.  Carotid ultrasound and echocardiogram are pending.  Lipid profile and A1c also pending.  Vitals:   08/07/19 0037 08/07/19 0400 08/07/19 0844 08/07/19 1205  BP: (!) 179/84 (!) 183/96 (!) 178/92 (!) 177/76  Pulse: 94 92 87 83  Resp:  16 18 19   Temp: 98 F (36.7 C) 98 F (36.7 C) 98.7 F (37.1 C) 98.3 F (36.8 C)  TempSrc: Oral Oral Oral Oral  SpO2: 96% 97% 97% 96%  Weight:      Height:       CBC:  Recent Labs  Lab 08/06/19 1322  WBC 13.2*  HGB 15.4  HCT 46.7  MCV 92.7  PLT 290   Basic Metabolic Panel:  Recent Labs  Lab 08/06/19 1322  NA 138  K 3.8  CL 106  CO2 20*  GLUCOSE 145*  BUN 9  CREATININE 0.84  CALCIUM 8.9   Lipid Panel: No results for input(s): CHOL, TRIG, HDL, CHOLHDL, VLDL, LDLCALC in the last 168 hours. HgbA1c: No results for input(s): HGBA1C in the last 168 hours. Urine Drug Screen: No results for input(s): LABOPIA, COCAINSCRNUR, LABBENZ, AMPHETMU, THCU, LABBARB in the last 168 hours.  Alcohol Level No results for input(s): ETH in the last 168 hours.  IMAGING past 24 hours CT Head Wo Contrast  Result Date: 08/06/2019 CLINICAL DATA:  Dizziness EXAM: CT HEAD WITHOUT CONTRAST TECHNIQUE: Contiguous axial images were obtained from the base of the skull through the vertex without intravenous contrast. COMPARISON:  None. FINDINGS: Brain: There is no acute intracranial hemorrhage, mass effect, or edema. Gray-white differentiation is preserved. There is no extra-axial fluid collection. Ventricles and sulci are within normal limits in size and configuration. Minimal patchy hypoattenuation in  the supratentorial white matter is nonspecific but may reflect minor chronic microvascular ischemic changes. Vascular: There is atherosclerotic calcification at the skull base. Skull: Calvarium is unremarkable. Sinuses/Orbits: No acute finding. Other: None. IMPRESSION: No acute intracranial abnormality. Electronically Signed   By: 08/08/2019 M.D.   On: 08/06/2019 14:26   MR ANGIO HEAD WO CONTRAST  Result Date: 08/06/2019 CLINICAL DATA:  Dizziness, nonspecific. EXAM: MRI HEAD WITHOUT CONTRAST MRA HEAD WITHOUT CONTRAST TECHNIQUE: Multiplanar, multiecho pulse sequences of the brain and surrounding structures were obtained without intravenous contrast. Angiographic images of the head were obtained using MRA technique without contrast. COMPARISON:  Head CT 08/06/2019 FINDINGS: MRI HEAD FINDINGS Brain: Mild intermittent motion degradation. Cerebral volume is normal for age. There is a large region of restricted diffusion within the right cerebellum within the right PICA territory consistent with acute infarction. There is vague corresponding T2/FLAIR hyperintensity at this site. Mild scattered T2/FLAIR hyperintensity within the cerebral white matter is nonspecific, but consistent with chronic small vessel ischemic disease. There are small chronic thalamic lacunar infarcts bilaterally. A small chronic infarct is also present within the right cerebellum. No evidence of intracranial mass. No chronic intracranial blood products. No extra-axial fluid collection. No midline shift. Incidentally noted cavum septum pellucidum and cavum vergae. Vascular: Suspected right frontal lobe developmental venous anomaly. Otherwise reported below. Skull and upper cervical spine: No focal marrow lesion. Sinuses/Orbits: Visualized orbits show no  acute finding. Mild ethmoid sinus mucosal thickening. Trace fluid within right mastoid air cells. MRA HEAD FINDINGS The intracranial internal carotid arteries are patent. The M1 middle cerebral  arteries are patent without significant stenosis. No M2 proximal branch occlusion or high-grade proximal stenosis is identified. Multifocal mild-to-moderate atherosclerotic irregularity of M2 and more distal MCA branch vessels bilaterally. The anterior cerebral arteries are patent. No intracranial aneurysm is identified. No flow related signal is appreciated within the distal cervical right vertebral artery. The proximal V4 right vertebral artery is markedly diminutive and irregular in appearance suggestive of high-grade stenosis and/or partial occlusion. The right PICA is poorly delineated and likely occluded. The intracranial left vertebral artery is patent without significant stenosis, as is the basilar artery. The posterior cerebral arteries are patent proximally without significant stenosis. These results were called by telephone at the time of interpretation on 08/06/2019 at 5:30 pm to provider Dr. Juleen China, Who verbally acknowledged these results. IMPRESSION: MRI brain: 1. Large acute infarct within the right cerebellar hemisphere within the right PICA territory. 2. Mild chronic small vessel ischemic changes within the cerebral white matter. 3. Chronic small-vessel infarcts within the thalami bilaterally and right cerebellum. MRA head: Absence of appreciable flow-related signal within the distal right cervical vertebral artery. Additionally, the proximal intracranial right vertebral artery is markedly irregular and diminutive in appearance suggestive of high-grade stenosis and/or partial occlusion. The right PICA is poorly delineated and likely occluded. CTA neck may be obtained for further characterization of these findings and to assess for possible right vertebral artery dissection. Electronically Signed   By: Jackey Loge DO   On: 08/06/2019 17:32   MR Angiogram Neck W or Wo Contrast  Result Date: 08/06/2019 CLINICAL DATA:  Dizziness and nausea. EXAM: MRA NECK WITHOUT AND WITH CONTRAST TECHNIQUE:  Multiplanar and multiecho pulse sequences of the neck were obtained without and with intravenous contrast. Angiographic images of the neck were obtained using MRA technique without and with intravenous contrast. No fat-suppressed T1-weighted sequence was performed. CONTRAST:  46mL GADAVIST GADOBUTROL 1 MMOL/ML IV SOLN COMPARISON:  None. FINDINGS: Vertebral arteries: There is diffuse tapering of the right vertebral artery along its entire length, with enhancement on postcontrast images. On time-of-flight images, there is no flow related enhancement of the right vertebral artery. Left vertebral artery is normal on both sequences. Carotid arteries: There is atherosclerotic narrowing of the proximal right internal carotid artery measuring less than 50% by NASCET criteria. On the left there is atherosclerotic narrowing of the proximal internal carotid artery that measures approximately 60 % by NASCET criteria. IMPRESSION: 1. Narrowing of the right vertebral artery along its entire length on postcontrast imaging with no flow related enhancement on time-of-flight imaging. Acuity remains uncertain. 2. Bilateral internal carotid artery atherosclerosis measuring less than 50% on the right and 60% the left by NASCET criteria. Electronically Signed   By: Deatra Robinson M.D.   On: 08/06/2019 20:11   MR BRAIN WO CONTRAST  Result Date: 08/06/2019 CLINICAL DATA:  Dizziness, nonspecific. EXAM: MRI HEAD WITHOUT CONTRAST MRA HEAD WITHOUT CONTRAST TECHNIQUE: Multiplanar, multiecho pulse sequences of the brain and surrounding structures were obtained without intravenous contrast. Angiographic images of the head were obtained using MRA technique without contrast. COMPARISON:  Head CT 08/06/2019 FINDINGS: MRI HEAD FINDINGS Brain: Mild intermittent motion degradation. Cerebral volume is normal for age. There is a large region of restricted diffusion within the right cerebellum within the right PICA territory consistent with acute  infarction. There is vague corresponding T2/FLAIR hyperintensity  at this site. Mild scattered T2/FLAIR hyperintensity within the cerebral white matter is nonspecific, but consistent with chronic small vessel ischemic disease. There are small chronic thalamic lacunar infarcts bilaterally. A small chronic infarct is also present within the right cerebellum. No evidence of intracranial mass. No chronic intracranial blood products. No extra-axial fluid collection. No midline shift. Incidentally noted cavum septum pellucidum and cavum vergae. Vascular: Suspected right frontal lobe developmental venous anomaly. Otherwise reported below. Skull and upper cervical spine: No focal marrow lesion. Sinuses/Orbits: Visualized orbits show no acute finding. Mild ethmoid sinus mucosal thickening. Trace fluid within right mastoid air cells. MRA HEAD FINDINGS The intracranial internal carotid arteries are patent. The M1 middle cerebral arteries are patent without significant stenosis. No M2 proximal branch occlusion or high-grade proximal stenosis is identified. Multifocal mild-to-moderate atherosclerotic irregularity of M2 and more distal MCA branch vessels bilaterally. The anterior cerebral arteries are patent. No intracranial aneurysm is identified. No flow related signal is appreciated within the distal cervical right vertebral artery. The proximal V4 right vertebral artery is markedly diminutive and irregular in appearance suggestive of high-grade stenosis and/or partial occlusion. The right PICA is poorly delineated and likely occluded. The intracranial left vertebral artery is patent without significant stenosis, as is the basilar artery. The posterior cerebral arteries are patent proximally without significant stenosis. These results were called by telephone at the time of interpretation on 08/06/2019 at 5:30 pm to provider Dr. Juleen ChinaKohut, Who verbally acknowledged these results. IMPRESSION: MRI brain: 1. Large acute infarct within  the right cerebellar hemisphere within the right PICA territory. 2. Mild chronic small vessel ischemic changes within the cerebral white matter. 3. Chronic small-vessel infarcts within the thalami bilaterally and right cerebellum. MRA head: Absence of appreciable flow-related signal within the distal right cervical vertebral artery. Additionally, the proximal intracranial right vertebral artery is markedly irregular and diminutive in appearance suggestive of high-grade stenosis and/or partial occlusion. The right PICA is poorly delineated and likely occluded. CTA neck may be obtained for further characterization of these findings and to assess for possible right vertebral artery dissection. Electronically Signed   By: Jackey LogeKyle  Golden DO   On: 08/06/2019 17:32    PHYSICAL EXAM Pleasant middle-age Caucasian male who appears quite sick and nauseous while sitting up. . Afebrile. Head is nontraumatic. Neck is supple without bruit.    Cardiac exam no murmur or gallop. Lungs are clear to auscultation. Distal pulses are well felt. Neurological Exam : Is awake alert oriented time place and person.  Speech and language appear normal.  Extraocular movements are full range with sustained nystagmus on lateral gaze with down beating component.  Face is symmetric.  Palate elevates normally.  Tongue midline.  Motor system exam shows symmetric upper and lower extremity strength without any focal weakness.  Coordination is slow but accurate.  Patient has truncal ataxia and gait not tested.   ASSESSMENT/PLAN Gerald Taylor is a 64 y.o. male with history of HTN, tobacco use presenting with dizziness on awakening Thursday.   Stroke:   Large R cerebellar infarct in setting of R PICA occlusion throughtout its length, felt to be secondary to large vessel disease source  CT head No acute abnormality.   MRI  Large R cerebellar infarct. Small vessel disease. Atrophy. Old B thalamic and R cerebellar infarcts.  MRA head  Distal  R cervical VA slow flow. R VA irregular and small, likely high-grade stenosis. R PICA likely occluded.   MRA neck R VA narrowing w/ no  flow seen. R ICA 50%, L ICA 50% stenoses.  2D Echo EF 60-65%. No source of embolus   LDL pending   HgbA1c pending   VTE prophylaxis - Heparin 5000 units sq tid   aspirin 81 mg daily prior to admission, now on aspirin 81 mg daily and clopidogrel 75 mg daily following load. Continue DAPT x 3 months then plavix alone.    Therapy recommendations:  pending   Disposition:  pending   Hypertension  Stable on the high side . Permissive hypertension (OK if < 220/120) but gradually normalize in 5-7 days . Long-term BP goal normotensive  Hyperlipidemia  Home meds:  pravachol 20 + fish oil, pravachol resumed in hospital  LDL pending, goal < 70  Increase statin dose to at least lipitor 40 once LDL completed  Continue statin at discharge  Other Stroke Risk Factors  Cigarette smoker, advised to stop smoking  ETOH use, advised to drink no more than 2 drink(s) a day  Obesity, Body mass index is 30.71 kg/m., recommend weight loss, diet and exercise as appropriate   Other Active Problems  CKD  GERD  Leukocytosis 13.2  Hospital day # 1  He presented with dizziness and ataxia due to right cerebellar infarct likely from large vessel right vertebral artery atherosclerotic disease.  Recommend dual antiplatelet therapy of aspirin and Plavix for 3 months followed by aspirin alone.  Aggressive risk factor modification.  Check carotid Dopplers, echocardiogram, lipid profile and hemoglobin A1c.  Long discussion with patient and wife and answered questions.  Discussed with Dr. Catha Gosselin.  Greater than 50% time during this 35-minute visit was spent in counseling and coordination of care about cerebral stroke and vertebral artery atherosclerotic disease and answering questions Gerald Heady, MD To contact Stroke Continuity provider, please refer to  [email or web address]. After hours, contact General Neurology

## 2019-08-07 NOTE — Evaluation (Signed)
Physical Therapy Evaluation Patient Details Name: Gerald Taylor MRN: [id number] DOB: 09/13/1955 Today's Date: 08/07/2019   History of Present Illness  Pt is a 64 y.o. male with PMH of anemia, CKD, GERD, HTN. and SOB with exertion. Presented to ED with dizziness or vertigo, double vision, as well some nausea, no vomiting and mild headache. MRI brain which was significant for right cerebellar infarct, as well MRA head was significant for poor vertebral circulation.  Clinical Impression  Pt in bed upon arrival of PT, agreeable to evaluation at this time. Prior to admission the pt was completely independent with mobility and ADLs, able to drive, and living with his wife in a home with no steps to enter. The pt now presents with limitations in functional mobility, strength, stability, coordination, and activity tolerance due to above dx and resulting dizziness with positional changes, and will continue to benefit from skilled PT to address these deficits. The pt was able to complete bed mobility without physical assist, but further transfers and ambulation required significant assist of 2 due to deficits in coordination and stability that are further impacted by significant dizziness with changes in position. The pt will continue to benefit from skilled PT to further progress functional mobility and activity tolerance, but is a great candidate for intensive therapies prior to d/c home to facilitate return to prior level of independence.     Follow Up Recommendations CIR    Equipment Recommendations   (defer to post acute)    Recommendations for Other Services Rehab consult     Precautions / Restrictions Precautions Precautions: Fall Precaution Comments: watch BP, dizzy with all mobility will benefit from visual targets Restrictions Weight Bearing Restrictions: No      Mobility  Bed Mobility Overal bed mobility: Modified Independent             General bed mobility comments: increased  time and effort to get to EOB from supine, but no assist needed  Transfers Overall transfer level: Needs assistance Equipment used: 2 person hand held assist Transfers: Sit to/from Stand Sit to Stand: +2 physical assistance;+2 safety/equipment;Mod assist         General transfer comment: first trial unsuccessful, but pt able to stand with modA of 2 through [facility] with modA to maintain upright and cues for visual targets due to significant dizziness with changes in position  Ambulation/Gait Ambulation/Gait assistance: Mod assist;+2 physical assistance;+2 safety/equipment Gait Distance (Feet): 3 Feet Assistive device: 2 person hand held assist Gait Pattern/deviations: Step-to pattern;Ataxic   Gait velocity interpretation: <1.31 ft/sec, indicative of household ambulator General Gait Details: pt with short ataxic steps with need for modA to maintain upright due to significant dizziness and instability.  Modified Rankin (Stroke Patients Only) Modified Rankin (Stroke Patients Only) Pre-Morbid Rankin Score: No symptoms Modified Rankin: Moderately severe disability     Balance Overall balance assessment: Needs assistance Sitting-balance support: Feet supported;Single extremity supported Sitting balance-Leahy Scale: Poor Sitting balance - Comments: relied on single hand support in sitting Postural control: Left lateral lean Standing balance support: Bilateral upper extremity supported Standing balance-Leahy Scale: Poor Standing balance comment: relied on BUE support in standing and modA of 2                             Pertinent Vitals/Pain Pain Assessment: Faces Faces Pain Scale: Hurts whole lot Pain Location: dizziness with movement, also c/o headache Pain Descriptors / Indicators: Grimacing Pain Intervention(s): Limited activity within  patient's tolerance;Monitored during session;Repositioned    Home Living Family/patient expects to be discharged to:: Private  residence Living Arrangements: Spouse/significant other Available Help at Discharge: Family;Available 24 hours/day Type of Home: House Home Access: Level entry     Home Layout: One level Home Equipment: None      Prior Function Level of Independence: Independent         Comments: pt completely independent with mobility, driving, but retired prior to onset of sx     Hand Dominance   Dominant Hand: Right    Extremity/Trunk Assessment   Upper Extremity Assessment Upper Extremity Assessment: Defer to OT evaluation    Lower Extremity Assessment Lower Extremity Assessment: Overall WFL for tasks assessed (strength generally functional, eval limited by pt dizziness and distress sitting EOB, will benefit from continued eval of ataxia)    Cervical / Trunk Assessment Cervical / Trunk Assessment: Normal  Communication   Communication: No difficulties  Cognition Arousal/Alertness: Awake/alert Behavior During Therapy: WFL for tasks assessed/performed Overall Cognitive Status: Within Functional Limits for tasks assessed                                 General Comments: limited by obvious discomfort, but pt responses were accurate and appropriate      General Comments General comments (skin integrity, edema, etc.): pt with reports of sig dizziness with change in position, BP of 184/84 in sitting    Exercises     Assessment/Plan    PT Assessment Patient needs continued PT services  PT Problem List Decreased strength;Decreased activity tolerance;Decreased balance;Decreased coordination;Decreased mobility;Decreased safety awareness       PT Treatment Interventions DME instruction;Therapeutic exercise;Gait training;Stair training;Functional mobility training;Therapeutic activities;Patient/family education;Neuromuscular re-education;Balance training    PT Goals (Current goals can be found in the Care Plan section)  Acute Rehab PT Goals Patient Stated Goal: none  stated PT Goal Formulation: With patient Time For Goal Achievement: 08/21/19 Potential to Achieve Goals: Good    Frequency Min 4X/week   Barriers to discharge        Co-evaluation PT/OT/SLP Co-Evaluation/Treatment: Yes Reason for Co-Treatment: For patient/therapist safety;Complexity of the patient's impairments (multi-system involvement) PT goals addressed during session: Mobility/safety with mobility;Balance OT goals addressed during session: ADL's and self-care;Strengthening/ROM       AM-PAC PT "6 Clicks" Mobility  Outcome Measure Help needed turning from your back to your side while in a flat bed without using bedrails?: None Help needed moving from lying on your back to sitting on the side of a flat bed without using bedrails?: None Help needed moving to and from a bed to a chair (including a wheelchair)?: A Lot Help needed standing up from a chair using your arms (e.g., wheelchair or bedside chair)?: A Lot Help needed to walk in hospital room?: A Lot Help needed climbing 3-5 steps with a railing? : Total 6 Click Score: 15    End of Session Equipment Utilized During Treatment: Gait belt Activity Tolerance: Patient limited by pain;Other (comment) (significant dizziness with changes in position) Patient left: in bed;with family/visitor present;with call bell/phone within reach;with bed alarm set (MD present to assess pt) Nurse Communication: Mobility status PT Visit Diagnosis: Unsteadiness on feet (R26.81);Difficulty in walking, not elsewhere classified (R26.2);Dizziness and giddiness (R42)    Time: 1610-9604 PT Time Calculation (min) (ACUTE ONLY): 18 min   Charges:   PT Evaluation $PT Eval Moderate Complexity: 1 Mod  Rolm Baptise, PT, DPT   Acute Rehabilitation Department Pager #: [phone]  Gaetana Michaelis 08/07/2019, 2:30 PM

## 2019-08-07 NOTE — Consult Note (Signed)
Physical Medicine and Rehabilitation Consult Reason for Consult: Dizziness with nausea vomiting Referring Physician: [facility]   HPI: Gerald Taylor is a 64 y.o. right-handed male with history of hypertension, tobacco abuse documented chronic renal insufficiency.  Per chart review patient lives with spouse independent prior to admission 1 level home with level entry.  Patient is retired.  Presented 08/06/2019 with dizziness/vertigo as well as nausea.  Cranial CT scan negative.  MRI/MRA showed large acute infarct within the right cerebellar hemisphere within the right PICA territory.  Chronic small vessel infarcts within the thalami bilaterally and right cerebellum.  Patient did not receive TPA.  MRA absence of appreciable flow related signal within the distal right cervical vertebral artery.  Additionally, the proximal intracranial right vertebral artery remarkably irregular and diminutive in appearance suggesting of high-grade stenosis and/or partial occlusion.  CTA of the neck bilateral internal carotid artery atherosclerosis measuring less than 50% on the right 60% on the left.  Echocardiogram with ejection fraction of 65% no wall motion abnormalities.  Admission chemistries BUN 9 creatinine 0.84, WBC 13,200, urinalysis negative nitrite.  Currently on aspirin and Plavix for CVA prophylaxis.  Subcutaneous heparin for DVT prophylaxis.  Therapy evaluation completed with recommendations of physical medicine rehab consult.   Review of Systems  Constitutional: Negative for chills and fever.  HENT: Negative for hearing loss.   Eyes: Negative for blurred vision and double vision.  Respiratory: Positive for shortness of breath.        Shortness of breath with exertion  Cardiovascular: Negative for palpitations and leg swelling.  Gastrointestinal: Positive for constipation. Negative for heartburn, nausea and vomiting.       GERD  Genitourinary: Negative for dysuria, flank pain and hematuria.    Musculoskeletal: Positive for joint pain and myalgias.  Skin: Negative for rash.  Neurological: Positive for dizziness.       Vertigo  All other systems reviewed and are negative.  Past Medical History:  Diagnosis Date  . Anemia    hx of anemia as a baby  . Chronic kidney disease    bladder stone   . GERD (gastroesophageal reflux disease)    hx of   . Hypertension   . Shortness of breath    with exertion    Past Surgical History:  Procedure Laterality Date  . APPENDECTOMY    . HEMORRHOID SURGERY     Family History  Problem Relation Age of Onset  . CAD Mother   . CAD Father    Social History:  reports that he has been smoking cigarettes. He has a 38.00 pack-year smoking history. He has quit using smokeless tobacco. He reports current alcohol use. He reports that he does not use drugs. Allergies: No Known Allergies Medications Prior to Admission  Medication Sig Dispense Refill  . acetaminophen (TYLENOL) 500 MG tablet Take 500 mg by mouth every 6 (six) hours as needed.    Marland Kitchen aspirin EC 81 MG tablet Take 81 mg by mouth at bedtime.      Marland Kitchen lisinopril (ZESTRIL) 20 MG tablet Take 20 mg by mouth daily.    . Omega-3 Fatty Acids (FISH OIL) 1000 MG CAPS Take 1 capsule by mouth daily.    Marland Kitchen buPROPion (WELLBUTRIN XL) 150 MG 24 hr tablet daily. (Patient not taking: Reported on 08/06/2019)  5  . polyethylene glycol-electrolytes (TRILYTE) 420 g solution Take 4,000 mLs by mouth as directed. (Patient not taking: Reported on 08/06/2019) 4000 mL 0  . pravastatin (PRAVACHOL)  20 MG tablet Take 20 mg by mouth at bedtime. (Patient not taking: Reported on 08/06/2019)      Home: Home Living Family/patient expects to be discharged to:: Private residence Living Arrangements: Spouse/significant other Available Help at Discharge: Family, Available 24 hours/day Type of Home: House Home Access: Level entry Home Layout: One level Bathroom Shower/Tub: Engineer, manufacturing systems: Standard Home  Equipment: None  Functional History: Prior Function Level of Independence: Independent Comments: pt completely independent with mobility, driving, but retired prior to onset of sx Functional Status:  Mobility: Bed Mobility Overal bed mobility: Modified Independent General bed mobility comments: increased time and effort to get to EOB from supine, but no assist needed Transfers Overall transfer level: Needs assistance Equipment used: 2 person hand held assist Transfers: Sit to/from Stand Sit to Stand: +2 physical assistance, +2 safety/equipment, Mod assist General transfer comment: first trial unsuccessful, but pt able to stand with modA of 2 through [facility] with modA to maintain upright and cues for visual targets due to significant dizziness with changes in position Ambulation/Gait Ambulation/Gait assistance: Mod assist, +2 physical assistance, +2 safety/equipment Gait Distance (Feet): 3 Feet Assistive device: 2 person hand held assist Gait Pattern/deviations: Step-to pattern, Ataxic General Gait Details: pt with short ataxic steps with need for modA to maintain upright due to significant dizziness and instability. Gait velocity interpretation: <1.31 ft/sec, indicative of household ambulator    ADL: ADL Overall ADL's : Needs assistance/impaired Eating/Feeding: Minimal assistance, Sitting Eating/Feeding Details (indicate cue type and reason): Min A due to vision and coordination Grooming: Sitting, Min guard Grooming Details (indicate cue type and reason): min guard with sitting for EOB activities Upper Body Bathing: Sitting, Minimal assistance Upper Body Bathing Details (indicate cue type and reason): min A for dynamic balance in sitting Lower Body Bathing: Sit to/from stand, Sitting/lateral leans, +2 for physical assistance, Moderate assistance Lower Body Bathing Details (indicate cue type and reason): Mod A +2 for sit<>stand Upper Body Dressing : Minimal assistance, Sitting Upper  Body Dressing Details (indicate cue type and reason): Min A for dynamic sitting balance Lower Body Dressing: Total assistance, +2 for physical assistance, Sit to/from stand Lower Body Dressing Details (indicate cue type and reason): Total A +2 for sit to stand and reliance on B UE support Toilet Transfer: +2 for physical assistance, Stand-pivot, BSC, Moderate assistance Toilet Transfer Details (indicate cue type and reason): Mod A +2 for stand pivot Toileting- Clothing Manipulation and Hygiene: Total assistance, Sit to/from stand, +2 for physical assistance Toileting - Clothing Manipulation Details (indicate cue type and reason): total A +2 for reliance on BUE supports Tub/Shower Transfer Details (indicate cue type and reason): Deferred due to pt safety General ADL Comments: Pt with increased dizziness and nausea with positional change and eye movement. Limited eval due to increased heart rate and dizziness.   Cognition: Cognition Overall Cognitive Status: Within Functional Limits for tasks assessed Orientation Level: Oriented X4 Cognition Arousal/Alertness: Awake/alert Behavior During Therapy: WFL for tasks assessed/performed Overall Cognitive Status: Within Functional Limits for tasks assessed General Comments: limited by obvious discomfort, but pt responses were accurate and appropriate  Blood pressure (!) 177/76, pulse 83, temperature 98.3 F (36.8 C), temperature source Oral, resp. rate 19, height 5\' 10"  (1.778 m), weight (!) 97.1 kg, SpO2 96 %. Physical Exam Vitals and nursing note reviewed.  Constitutional:      General: He is not in acute distress.    Appearance: He is obese.  HENT:     Head: Normocephalic and  atraumatic.  Eyes:     Extraocular Movements: Extraocular movements intact.     Conjunctiva/sclera: Conjunctivae normal.     Pupils: Pupils are equal, round, and reactive to light.     Comments: The patient complains of some blurring of vision with lateral gaze, no  evidence of nystagmus  Cardiovascular:     Rate and Rhythm: Normal rate and regular rhythm.     Heart sounds: Normal heart sounds. No murmur heard.   Pulmonary:     Effort: Pulmonary effort is normal. No respiratory distress.     Breath sounds: Normal breath sounds. No wheezing.  Abdominal:     General: Abdomen is flat. Bowel sounds are normal. There is no distension.     Palpations: Abdomen is soft.  Musculoskeletal:     Cervical back: No rigidity.  Skin:    General: Skin is warm and dry.  Neurological:     Mental Status: He is alert and oriented to person, place, and time.     Cranial Nerves: No dysarthria.     Motor: No weakness.     Coordination: Heel to Shin Test abnormal. Finger-Nose-Finger Test normal.     Comments: Patient is alert in no acute distress.  Makes good eye contact with examiner and follows commands.  Oriented x3.  Fair awareness of deficits.  Psychiatric:        Mood and Affect: Mood normal.        Behavior: Behavior normal.     Results for orders placed or performed during the hospital encounter of 08/06/19 (from the past 24 hour(s))  SARS Coronavirus 2 by RT PCR (hospital order, performed in [facility] hospital lab) Nasopharyngeal Nasopharyngeal Swab     Status: None   Collection Time: 08/06/19  7:29 PM   Specimen: Nasopharyngeal Swab  Result Value Ref Range   SARS Coronavirus 2 NEGATIVE NEGATIVE  Urinalysis, Routine w reflex microscopic     Status: Abnormal   Collection Time: 08/07/19  8:42 AM  Result Value Ref Range   Color, Urine YELLOW YELLOW   APPearance CLEAR CLEAR   Specific Gravity, Urine 1.019 1.005 - 1.030   pH 6.0 5.0 - 8.0   Glucose, UA NEGATIVE NEGATIVE mg/dL   Hgb urine dipstick MODERATE (A) NEGATIVE   Bilirubin Urine NEGATIVE NEGATIVE   Ketones, ur NEGATIVE NEGATIVE mg/dL   Protein, ur NEGATIVE NEGATIVE mg/dL   Nitrite NEGATIVE NEGATIVE   Leukocytes,Ua NEGATIVE NEGATIVE   RBC / HPF 11-20 0 - 5 RBC/hpf   WBC, UA 0-5 0 - 5 WBC/hpf     Bacteria, UA NONE SEEN NONE SEEN   Squamous Epithelial / LPF 0-5 0 - 5   CT Head Wo Contrast  Result Date: 08/06/2019 CLINICAL DATA:  Dizziness EXAM: CT HEAD WITHOUT CONTRAST TECHNIQUE: Contiguous axial images were obtained from the base of the skull through the vertex without intravenous contrast. COMPARISON:  None. FINDINGS: Brain: There is no acute intracranial hemorrhage, mass effect, or edema. Gray-white differentiation is preserved. There is no extra-axial fluid collection. Ventricles and sulci are within normal limits in size and configuration. Minimal patchy hypoattenuation in the supratentorial white matter is nonspecific but may reflect minor chronic microvascular ischemic changes. Vascular: There is atherosclerotic calcification at the skull base. Skull: Calvarium is unremarkable. Sinuses/Orbits: No acute finding. Other: None. IMPRESSION: No acute intracranial abnormality. Electronically Signed   By: Guadlupe Spanish M.D.   On: 08/06/2019 14:26   MR ANGIO HEAD WO CONTRAST  Result Date: 08/06/2019 CLINICAL DATA:  Dizziness, nonspecific. EXAM: MRI HEAD WITHOUT CONTRAST MRA HEAD WITHOUT CONTRAST TECHNIQUE: Multiplanar, multiecho pulse sequences of the brain and surrounding structures were obtained without intravenous contrast. Angiographic images of the head were obtained using MRA technique without contrast. COMPARISON:  Head CT 08/06/2019 FINDINGS: MRI HEAD FINDINGS Brain: Mild intermittent motion degradation. Cerebral volume is normal for age. There is a large region of restricted diffusion within the right cerebellum within the right PICA territory consistent with acute infarction. There is vague corresponding T2/FLAIR hyperintensity at this site. Mild scattered T2/FLAIR hyperintensity within the cerebral white matter is nonspecific, but consistent with chronic small vessel ischemic disease. There are small chronic thalamic lacunar infarcts bilaterally. A small chronic infarct is also present  within the right cerebellum. No evidence of intracranial mass. No chronic intracranial blood products. No extra-axial fluid collection. No midline shift. Incidentally noted cavum septum pellucidum and cavum vergae. Vascular: Suspected right frontal lobe developmental venous anomaly. Otherwise reported below. Skull and upper cervical spine: No focal marrow lesion. Sinuses/Orbits: Visualized orbits show no acute finding. Mild ethmoid sinus mucosal thickening. Trace fluid within right mastoid air cells. MRA HEAD FINDINGS The intracranial internal carotid arteries are patent. The M1 middle cerebral arteries are patent without significant stenosis. No M2 proximal branch occlusion or high-grade proximal stenosis is identified. Multifocal mild-to-moderate atherosclerotic irregularity of M2 and more distal MCA branch vessels bilaterally. The anterior cerebral arteries are patent. No intracranial aneurysm is identified. No flow related signal is appreciated within the distal cervical right vertebral artery. The proximal V4 right vertebral artery is markedly diminutive and irregular in appearance suggestive of high-grade stenosis and/or partial occlusion. The right PICA is poorly delineated and likely occluded. The intracranial left vertebral artery is patent without significant stenosis, as is the basilar artery. The posterior cerebral arteries are patent proximally without significant stenosis. These results were called by telephone at the time of interpretation on 08/06/2019 at 5:30 pm to provider Dr. Juleen China, Who verbally acknowledged these results. IMPRESSION: MRI brain: 1. Large acute infarct within the right cerebellar hemisphere within the right PICA territory. 2. Mild chronic small vessel ischemic changes within the cerebral white matter. 3. Chronic small-vessel infarcts within the thalami bilaterally and right cerebellum. MRA head: Absence of appreciable flow-related signal within the distal right cervical vertebral  artery. Additionally, the proximal intracranial right vertebral artery is markedly irregular and diminutive in appearance suggestive of high-grade stenosis and/or partial occlusion. The right PICA is poorly delineated and likely occluded. CTA neck may be obtained for further characterization of these findings and to assess for possible right vertebral artery dissection. Electronically Signed   By: Jackey Loge DO   On: 08/06/2019 17:32   MR Angiogram Neck W or Wo Contrast  Result Date: 08/06/2019 CLINICAL DATA:  Dizziness and nausea. EXAM: MRA NECK WITHOUT AND WITH CONTRAST TECHNIQUE: Multiplanar and multiecho pulse sequences of the neck were obtained without and with intravenous contrast. Angiographic images of the neck were obtained using MRA technique without and with intravenous contrast. No fat-suppressed T1-weighted sequence was performed. CONTRAST:  10mL GADAVIST GADOBUTROL 1 MMOL/ML IV SOLN COMPARISON:  None. FINDINGS: Vertebral arteries: There is diffuse tapering of the right vertebral artery along its entire length, with enhancement on postcontrast images. On time-of-flight images, there is no flow related enhancement of the right vertebral artery. Left vertebral artery is normal on both sequences. Carotid arteries: There is atherosclerotic narrowing of the proximal right internal carotid artery measuring less than 50% by NASCET criteria. On the left there  is atherosclerotic narrowing of the proximal internal carotid artery that measures approximately 60 % by NASCET criteria. IMPRESSION: 1. Narrowing of the right vertebral artery along its entire length on postcontrast imaging with no flow related enhancement on time-of-flight imaging. Acuity remains uncertain. 2. Bilateral internal carotid artery atherosclerosis measuring less than 50% on the right and 60% the left by NASCET criteria. Electronically Signed   By: Deatra RobinsonKevin  Herman M.D.   On: 08/06/2019 20:11   MR BRAIN WO CONTRAST  Result Date:  08/06/2019 CLINICAL DATA:  Dizziness, nonspecific. EXAM: MRI HEAD WITHOUT CONTRAST MRA HEAD WITHOUT CONTRAST TECHNIQUE: Multiplanar, multiecho pulse sequences of the brain and surrounding structures were obtained without intravenous contrast. Angiographic images of the head were obtained using MRA technique without contrast. COMPARISON:  Head CT 08/06/2019 FINDINGS: MRI HEAD FINDINGS Brain: Mild intermittent motion degradation. Cerebral volume is normal for age. There is a large region of restricted diffusion within the right cerebellum within the right PICA territory consistent with acute infarction. There is vague corresponding T2/FLAIR hyperintensity at this site. Mild scattered T2/FLAIR hyperintensity within the cerebral white matter is nonspecific, but consistent with chronic small vessel ischemic disease. There are small chronic thalamic lacunar infarcts bilaterally. A small chronic infarct is also present within the right cerebellum. No evidence of intracranial mass. No chronic intracranial blood products. No extra-axial fluid collection. No midline shift. Incidentally noted cavum septum pellucidum and cavum vergae. Vascular: Suspected right frontal lobe developmental venous anomaly. Otherwise reported below. Skull and upper cervical spine: No focal marrow lesion. Sinuses/Orbits: Visualized orbits show no acute finding. Mild ethmoid sinus mucosal thickening. Trace fluid within right mastoid air cells. MRA HEAD FINDINGS The intracranial internal carotid arteries are patent. The M1 middle cerebral arteries are patent without significant stenosis. No M2 proximal branch occlusion or high-grade proximal stenosis is identified. Multifocal mild-to-moderate atherosclerotic irregularity of M2 and more distal MCA branch vessels bilaterally. The anterior cerebral arteries are patent. No intracranial aneurysm is identified. No flow related signal is appreciated within the distal cervical right vertebral artery. The  proximal V4 right vertebral artery is markedly diminutive and irregular in appearance suggestive of high-grade stenosis and/or partial occlusion. The right PICA is poorly delineated and likely occluded. The intracranial left vertebral artery is patent without significant stenosis, as is the basilar artery. The posterior cerebral arteries are patent proximally without significant stenosis. These results were called by telephone at the time of interpretation on 08/06/2019 at 5:30 pm to provider Dr. Juleen ChinaKohut, Who verbally acknowledged these results. IMPRESSION: MRI brain: 1. Large acute infarct within the right cerebellar hemisphere within the right PICA territory. 2. Mild chronic small vessel ischemic changes within the cerebral white matter. 3. Chronic small-vessel infarcts within the thalami bilaterally and right cerebellum. MRA head: Absence of appreciable flow-related signal within the distal right cervical vertebral artery. Additionally, the proximal intracranial right vertebral artery is markedly irregular and diminutive in appearance suggestive of high-grade stenosis and/or partial occlusion. The right PICA is poorly delineated and likely occluded. CTA neck may be obtained for further characterization of these findings and to assess for possible right vertebral artery dissection. Electronically Signed   By: Jackey LogeKyle  Golden DO   On: 08/06/2019 17:32   ECHOCARDIOGRAM COMPLETE  Result Date: 08/07/2019    ECHOCARDIOGRAM REPORT   Patient Name:   Robb MatarJAMES M Lantigua Date of Exam: 08/07/2019 Medical Rec #:  [id number]      Height:       70.0 in Accession #:    [id number]  Weight:       214.0 lb Date of Birth:  1955/12/26     BSA:          2.148 m Patient Age:    63 years       BP:           178/92 mmHg Patient Gender: M              HR:           90 bpm. Exam Location:  Inpatient Procedure: 2D Echo Indications:    stroke 434.91  History:        Patient has prior history of Echocardiogram examinations, most                  recent 03/04/2017.  Sonographer:    Delcie Roch Referring Phys: [id number] DAWO[address] ELGERGAWY IMPRESSIONS  1. Left ventricular ejection fraction, by estimation, is 60 to 65%. The left ventricle has normal function. The left ventricle has no regional wall motion abnormalities. Left ventricular diastolic parameters were normal.  2. Right ventricular systolic function is normal. The right ventricular size is normal. Tricuspid regurgitation signal is inadequate for assessing PA pressure.  3. The mitral valve is normal in structure. Trivial mitral valve regurgitation.  4. The aortic valve has an indeterminant number of cusps. Aortic valve regurgitation is trivial. Mild aortic valve stenosis.  5. The inferior vena cava is normal in size with <50% respiratory variability, suggesting right atrial pressure of 8 mmHg. Comparison(s): No significant change from prior study. Conclusion(s)/Recommendation(s): No intracardiac source of embolism detected on this transthoracic study. A transesophageal echocardiogram is recommended to exclude cardiac source of embolism if clinically indicated. FINDINGS  Left Ventricle: Left ventricular ejection fraction, by estimation, is 60 to 65%. The left ventricle has normal function. The left ventricle has no regional wall motion abnormalities. The left ventricular internal cavity size was normal in size. There is  no left ventricular hypertrophy. Left ventricular diastolic parameters were normal. Right Ventricle: The right ventricular size is normal. No increase in right ventricular wall thickness. Right ventricular systolic function is normal. Tricuspid regurgitation signal is inadequate for assessing PA pressure. Left Atrium: Left atrial size was normal in size. Right Atrium: Right atrial size was normal in size. Pericardium: There is no evidence of pericardial effusion. Presence of pericardial fat pad. Mitral Valve: The mitral valve is normal in structure. Trivial mitral valve regurgitation.  Tricuspid Valve: The tricuspid valve is normal in structure. Tricuspid valve regurgitation is trivial. Aortic Valve: The aortic valve has an indeterminant number of cusps. . There is moderate thickening and moderate calcification of the aortic valve. Aortic valve regurgitation is trivial. Mild aortic stenosis is present. There is moderate thickening of the aortic valve. There is moderate calcification of the aortic valve. Aortic valve mean gradient measures 12.0 mmHg. Aortic valve peak gradient measures 22.3 mmHg. Aortic valve area, by VTI measures 1.85 cm. Pulmonic Valve: The pulmonic valve was not well visualized. Pulmonic valve regurgitation is not visualized. Aorta: The aortic root, ascending aorta and aortic arch are all structurally normal, with no evidence of dilitation or obstruction. Venous: The inferior vena cava is normal in size with less than 50% respiratory variability, suggesting right atrial pressure of 8 mmHg. IAS/Shunts: No atrial level shunt detected by color flow Doppler.  LEFT VENTRICLE PLAX 2D LVIDd:         4.80 cm  Diastology LVIDs:         3.20 cm  LV  e' lateral:   9.68 cm/s LV PW:         1.00 cm  LV E/e' lateral: 7.7 LV IVS:        0.90 cm  LV e' medial:    8.81 cm/s LVOT diam:     2.00 cm  LV E/e' medial:  8.5 LV SV:         82 LV SV Index:   38 LVOT Area:     3.14 cm  RIGHT VENTRICLE             IVC RV S prime:     15.90 cm/s  IVC diam: 1.80 cm TAPSE (M-mode): 2.4 cm LEFT ATRIUM             Index       RIGHT ATRIUM           Index LA diam:        3.80 cm 1.77 cm/m  RA Area:     14.90 cm LA Vol (A2C):   43.0 ml 20.02 ml/m RA Volume:   37.10 ml  17.27 ml/m LA Vol (A4C):   41.3 ml 19.23 ml/m LA Biplane Vol: 43.4 ml 20.20 ml/m  AORTIC VALVE AV Area (Vmax):    1.67 cm AV Area (Vmean):   1.60 cm AV Area (VTI):     1.85 cm AV Vmax:           236.00 cm/s AV Vmean:          162.500 cm/s AV VTI:            0.445 m AV Peak Grad:      22.3 mmHg AV Mean Grad:      12.0 mmHg LVOT Vmax:          125.50 cm/s LVOT Vmean:        82.700 cm/s LVOT VTI:          0.262 m LVOT/AV VTI ratio: 0.59  AORTA Ao Root diam: 3.20 cm Ao Asc diam:  3.40 cm MITRAL VALVE MV Area (PHT): 2.87 cm    SHUNTS MV Decel Time: 264 msec    Systemic VTI:  0.26 m MV E velocity: 75.00 cm/s  Systemic Diam: 2.00 cm MV A velocity: 89.10 cm/s MV E/A ratio:  0.84 Jodelle Red MD Electronically signed by Jodelle Red MD Signature Date/Time: 08/07/2019/12:54:59 PM    Final      PHYSICAL MEDICINE & REHABILITATION PROGRESS NOTE   Subjective/Complaints:    Objective:   No results found. Recent Labs    08/08/19 0235  WBC 13.2*  HGB 14.7  HCT 44.5  PLT 278   No results for input(s): NA, K, CL, CO2, GLUCOSE, BUN, CREATININE, CALCIUM in the last 72 hours.  Intake/Output Summary (Last 24 hours) at 08/10/2019 1549 Last data filed at 08/10/2019 1400 Gross per 24 hour  Intake 3690.65 ml  Output 900 ml  Net 2790.65 ml     Physical Exam: Vital Signs Blood pressure (!) 151/80, pulse 77, temperature 98 F (36.7 C), temperature source Oral, resp. rate 18, height  (1.778 m), weight (!) 97.1 kg, SpO2 96 %.        Care Tool:  Bathing              Bathing assist       Upper Body Dressing/Undressing Upper body dressing        Upper body assist      Lower Body Dressing/Undressing Lower body dressing  Lower body assist       Toileting Toileting    Toileting assist       Transfers Chair/bed transfer  Transfers assist           Locomotion Ambulation   Ambulation assist              Walk 10 feet activity   Assist           Walk 50 feet activity   Assist           Walk 150 feet activity   Assist           Walk 10 feet on uneven surface  activity   Assist           Wheelchair     Assist               Wheelchair 50 feet with 2 turns activity    Assist            Wheelchair  150 feet activity     Assist          Blood pressure (!) 151/80, pulse 77, temperature 98 F (36.7 C), temperature source Oral, resp. rate 18, height 5\' 10"  (1.778 m), weight (!) 97.1 kg, SpO2 96 %.   Assessment/Plan: 1. Diagnosis: Right cerebellar infarct with decreased balance  2. does the need for close, 24 hr/day medical supervision in concert with the patient's rehab needs make it unreasonable for this patient to be served in a less intensive setting? Yes 3. Co-Morbidities requiring supervision/potential complications: Chronic kidney disease, hypertension, tobacco abuse 4. Due to bladder management, bowel management, safety, skin/wound care, disease management, pain management and patient education, does the patient require 24 hr/day rehab nursing? Yes 5. Does the patient require coordinated care of a physician, rehab nurse, therapy disciplines of PT, OT to address physical and functional deficits in the context of the above medical diagnosis(es)? Yes Addressing deficits in the following areas: balance, endurance, locomotion, strength, transferring, bowel/bladder control, bathing, dressing, feeding, toileting and psychosocial support 6. Can the patient actively participate in an intensive therapy program of at least 3 hrs of therapy per day at least 5 days per week? Yes 7. The potential for patient to make measurable gains while on inpatient rehab is good 8. Anticipated functional outcomes upon discharge from inpatient rehab are modified independent  with PT, modified independent with OT, n/a with SLP. 9. Estimated rehab length of stay to reach the above functional goals is: 10 to 14 days 10. Anticipated discharge destination: Home 11. Overall Rehab/Functional Prognosis: good  RECOMMENDATIONS: This patient's condition is appropriate for continued rehabilitative care in the following setting: CIR Patient has agreed to participate in recommended program. Patient states that he does  not have insurance coverage for rehab Note that insurance prior authorization may be required for reimbursement for recommended care.  Comment:    "I have personally performed a face to face diagnostic evaluation of this patient.  Additionally, I have reviewed and concur with the physician assistant's documentation above."  LOS: 4 days A FACE TO FACE EVALUATION WAS PERFORMED  08/10/2019, 3:49 PM      08/12/2019 [place], PA-C 08/07/2019

## 2019-08-07 NOTE — Progress Notes (Signed)
Rehab Admissions Coordinator Note:  Patient was screened by Clois Dupes for appropriateness for an Inpatient Acute Rehab Consult per therapy recs. .  At this time, we are recommending Inpatient Rehab consult. I will place order per protocol.  Clois Dupes RN MSN 08/07/2019, 3:14 PM  I can be reached at [phone].

## 2019-08-07 NOTE — Progress Notes (Signed)
PROGRESS NOTE    Gerald Taylor  HEN:[id number] DOB: Mar 24, 1955 DOA: 08/06/2019 PCP: Assunta Found, MD   Brief Narrative:  HPI on 08/06/2019 by Dr. Huey Bienenstock Gerald Taylor  is a 64 y.o. male, with past medical history of hypertension, GERD, chronic renal insufficiency, patient presents to ED for evaluation of dizziness or vertigo, double vision, as well some nausea, no vomiting and mild headache, he reports his headache has resolved by now, but the symptoms were present upon him waking up from sleep this morning, reports vertigo exacerbated by him trying to move, he denies any tingling or numbness in his extremities, he denies any weakness or focal deficits in his extremities, he denies any such previous symptoms in the past. - in ED patient had MRI brain which was significant for right cerebellar infarct, as well MRA head was significant for poor vertebral circulation, patient received full dose aspirin in ED, ED physician discussed with nausea at [facility] who requested [place] to [facility] as patient high risk for edema given the size of infarct in the cerebral area, [facility] hospitalist were consulted to admit. Assessment & Plan   Acute CVA -Patient presented with dizziness, vertigo -CT head showed no acute intracranial abnormality -MRI brain showed large acute infarct within the right cerebellar hemisphere within the right PICA territory. -MRA head: Absence of appreciable flow-related signal on the distal right cervical vertebral artery.  Proximal intracranial right vertebral artery markedly irregular and diminutive in appearance-high-grade stenosis and/or partial occlusion. -MRA neck showed narrowing of the right vertebral artery.  Bilateral ICA atherosclerosis measuring less than 50% on the right and 50% on the left -Echocardiogram pending -Will order hemoglobin A1c as well as lipid panel -Neurology consulted and appreciated -PT, OT consulted and pending -Continue aspirin, plavix,  statin  Essential hypertension -Allow for permissive hypertension given acute CVA  Tobacco abuse -continue nicotine patch  Leukocytosis -Suspect reactive given acute CVA -UA unremarkable for infection, patient with no upper respiratory issues -Currently afebrile -Continue to monitor CBC  DVT Prophylaxis  heparin  Code Status: Full  Family Communication: none at bedside  Disposition Plan:  Status is: Inpatient  Remains inpatient appropriate because:Ongoing diagnostic testing needed not appropriate for outpatient work up   Dispo: The patient is from: Home              Anticipated d/c is to: Home              Anticipated d/c date is: 1 day              Patient currently is not medically stable to d/c.   Consultants Neurology  Procedures  Echocardiogram  Antibiotics   Anti-infectives (From admission, onward)   None      Subjective:   [name] seen and examined today.  Patient with no complaints this morning.  Just feels extremely tired.  Denies any current weakness.  Denies chest pain or shortness of breath, abdominal pain, nausea or vomiting, diarrhea or constipation.   Objective:   Vitals:   08/07/19 0037 08/07/19 0400 08/07/19 0844 08/07/19 1205  BP: (!) 179/84 (!) 183/96 (!) 178/92 (!) 177/76  Pulse: 94 92 87 83  Resp:  16 18 19   Temp: 98 F (36.7 C) 98 F (36.7 C) 98.7 F (37.1 C) 98.3 F (36.8 C)  TempSrc: Oral Oral Oral Oral  SpO2: 96% 97% 97% 96%  Weight:      Height:        Intake/Output Summary (Last  24 hours) at 08/07/2019 1209 Last data filed at 08/07/2019 1207 Gross per 24 hour  Intake --  Output 525 ml  Net -525 ml   Filed Weights   08/06/19 1224  Weight: (!) 97.1 kg    Exam  General: Well developed, well nourished, NAD, appears stated age  HEENT: NCAT, mucous membranes moist.   Cardiovascular: S1 S2 auscultated, RRR  Respiratory: Clear to auscultation bilaterally   Abdomen: Soft, nontender, nondistended, + bowel  sounds  Extremities: warm dry without cyanosis clubbing or edema  Neuro: AAOx3, nonfocal   Data Reviewed: I have personally reviewed following labs and imaging studies  CBC: Recent Labs  Lab 08/06/19 1322  WBC 13.2*  HGB 15.4  HCT 46.7  MCV 92.7  PLT 290   Basic Metabolic Panel: Recent Labs  Lab 08/06/19 1322  NA 138  K 3.8  CL 106  CO2 20*  GLUCOSE 145*  BUN 9  CREATININE 0.84  CALCIUM 8.9   GFR: Estimated Creatinine Clearance: 105.2 mL/min (by C-G formula based on SCr of 0.84 mg/dL). Liver Function Tests: Recent Labs  Lab 08/06/19 1322  AST 23  ALT 24  ALKPHOS 52  BILITOT 0.4  PROT 7.6  ALBUMIN 4.3   No results for input(s): LIPASE, AMYLASE in the last 168 hours. No results for input(s): AMMONIA in the last 168 hours. Coagulation Profile: No results for input(s): INR, PROTIME in the last 168 hours. Cardiac Enzymes: No results for input(s): CKTOTAL, CKMB, CKMBINDEX, TROPONINI in the last 168 hours. BNP (last 3 results) No results for input(s): PROBNP in the last 8760 hours. HbA1C: No results for input(s): HGBA1C in the last 72 hours. CBG: No results for input(s): GLUCAP in the last 168 hours. Lipid Profile: No results for input(s): CHOL, HDL, LDLCALC, TRIG, CHOLHDL, LDLDIRECT in the last 72 hours. Thyroid Function Tests: No results for input(s): TSH, T4TOTAL, FREET4, T3FREE, THYROIDAB in the last 72 hours. Anemia Panel: No results for input(s): VITAMINB12, FOLATE, FERRITIN, TIBC, IRON, RETICCTPCT in the last 72 hours. Urine analysis:    Component Value Date/Time   COLORURINE YELLOW 08/07/2019 0842   APPEARANCEUR CLEAR 08/07/2019 0842   LABSPEC 1.019 08/07/2019 0842   PHURINE 6.0 08/07/2019 0842   GLUCOSEU NEGATIVE 08/07/2019 0842   HGBUR MODERATE (A) 08/07/2019 0842   BILIRUBINUR NEGATIVE 08/07/2019 0842   KETONESUR NEGATIVE 08/07/2019 0842   PROTEINUR NEGATIVE 08/07/2019 0842   NITRITE NEGATIVE 08/07/2019 0842   LEUKOCYTESUR NEGATIVE  08/07/2019 0842   Sepsis Labs: @LABRCNTIP (procalcitonin:4,lacticidven:4)  ) Recent Results (from the past 240 hour(s))  SARS Coronavirus 2 by RT PCR (hospital order, performed in [facility] hospital lab) Nasopharyngeal Nasopharyngeal Swab     Status: None   Collection Time: 08/06/19  7:29 PM   Specimen: Nasopharyngeal Swab  Result Value Ref Range Status   SARS Coronavirus 2 NEGATIVE NEGATIVE Final    Comment: (NOTE) SARS-CoV-2 target nucleic acids are NOT DETECTED.  The SARS-CoV-2 RNA is generally detectable in upper and lower respiratory specimens during the acute phase of infection. The lowest concentration of SARS-CoV-2 viral copies this assay can detect is 250 copies / mL. A negative result does not preclude SARS-CoV-2 infection and should not be used as the sole basis for treatment or other patient management decisions.  A negative result may occur with improper specimen collection / handling, submission of specimen other than nasopharyngeal swab, presence of viral mutation(s) within the areas targeted by this assay, and inadequate number of viral copies (<250 copies / mL).  A negative result must be combined with clinical observations, patient history, and epidemiological information.  Fact Sheet for Patients:   BoilerBrush.com.cy  Fact Sheet for Healthcare Providers: https://pope.com/  This test is not yet approved or  cleared by the [place] FDA and has been authorized for detection and/or diagnosis of SARS-CoV-2 by FDA under an Emergency Use Authorization (EUA).  This EUA will remain in effect (meaning this test can be used) for the duration of the COVID-19 declaration under Section 564(b)(1) of the Act, 21 U.S.C. section 360bbb-3(b)(1), unless the authorization is terminated or revoked sooner.  Performed at [facility], [address], [place], [place] [address]       Radiology Studies: CT Head Wo  Contrast  Result Date: 08/06/2019 CLINICAL DATA:  Dizziness EXAM: CT HEAD WITHOUT CONTRAST TECHNIQUE: Contiguous axial images were obtained from the base of the skull through the vertex without intravenous contrast. COMPARISON:  None. FINDINGS: Brain: There is no acute intracranial hemorrhage, mass effect, or edema. Gray-white differentiation is preserved. There is no extra-axial fluid collection. Ventricles and sulci are within normal limits in size and configuration. Minimal patchy hypoattenuation in the supratentorial white matter is nonspecific but may reflect minor chronic microvascular ischemic changes. Vascular: There is atherosclerotic calcification at the skull base. Skull: Calvarium is unremarkable. Sinuses/Orbits: No acute finding. Other: None. IMPRESSION: No acute intracranial abnormality. Electronically Signed   By: Guadlupe Spanish M.D.   On: 08/06/2019 14:26   MR ANGIO HEAD WO CONTRAST  Result Date: 08/06/2019 CLINICAL DATA:  Dizziness, nonspecific. EXAM: MRI HEAD WITHOUT CONTRAST MRA HEAD WITHOUT CONTRAST TECHNIQUE: Multiplanar, multiecho pulse sequences of the brain and surrounding structures were obtained without intravenous contrast. Angiographic images of the head were obtained using MRA technique without contrast. COMPARISON:  Head CT 08/06/2019 FINDINGS: MRI HEAD FINDINGS Brain: Mild intermittent motion degradation. Cerebral volume is normal for age. There is a large region of restricted diffusion within the right cerebellum within the right PICA territory consistent with acute infarction. There is vague corresponding T2/FLAIR hyperintensity at this site. Mild scattered T2/FLAIR hyperintensity within the cerebral white matter is nonspecific, but consistent with chronic small vessel ischemic disease. There are small chronic thalamic lacunar infarcts bilaterally. A small chronic infarct is also present within the right cerebellum. No evidence of intracranial mass. No chronic intracranial  blood products. No extra-axial fluid collection. No midline shift. Incidentally noted cavum septum pellucidum and cavum vergae. Vascular: Suspected right frontal lobe developmental venous anomaly. Otherwise reported below. Skull and upper cervical spine: No focal marrow lesion. Sinuses/Orbits: Visualized orbits show no acute finding. Mild ethmoid sinus mucosal thickening. Trace fluid within right mastoid air cells. MRA HEAD FINDINGS The intracranial internal carotid arteries are patent. The M1 middle cerebral arteries are patent without significant stenosis. No M2 proximal branch occlusion or high-grade proximal stenosis is identified. Multifocal mild-to-moderate atherosclerotic irregularity of M2 and more distal MCA branch vessels bilaterally. The anterior cerebral arteries are patent. No intracranial aneurysm is identified. No flow related signal is appreciated within the distal cervical right vertebral artery. The proximal V4 right vertebral artery is markedly diminutive and irregular in appearance suggestive of high-grade stenosis and/or partial occlusion. The right PICA is poorly delineated and likely occluded. The intracranial left vertebral artery is patent without significant stenosis, as is the basilar artery. The posterior cerebral arteries are patent proximally without significant stenosis. These results were called by telephone at the time of interpretation on 08/06/2019 at 5:30 pm to provider Dr. Juleen China, Who verbally acknowledged these  results. IMPRESSION: MRI brain: 1. Large acute infarct within the right cerebellar hemisphere within the right PICA territory. 2. Mild chronic small vessel ischemic changes within the cerebral white matter. 3. Chronic small-vessel infarcts within the thalami bilaterally and right cerebellum. MRA head: Absence of appreciable flow-related signal within the distal right cervical vertebral artery. Additionally, the proximal intracranial right vertebral artery is markedly  irregular and diminutive in appearance suggestive of high-grade stenosis and/or partial occlusion. The right PICA is poorly delineated and likely occluded. CTA neck may be obtained for further characterization of these findings and to assess for possible right vertebral artery dissection. Electronically Signed   By: Jackey Loge DO   On: 08/06/2019 17:32   MR Angiogram Neck W or Wo Contrast  Result Date: 08/06/2019 CLINICAL DATA:  Dizziness and nausea. EXAM: MRA NECK WITHOUT AND WITH CONTRAST TECHNIQUE: Multiplanar and multiecho pulse sequences of the neck were obtained without and with intravenous contrast. Angiographic images of the neck were obtained using MRA technique without and with intravenous contrast. No fat-suppressed T1-weighted sequence was performed. CONTRAST:  10mL GADAVIST GADOBUTROL 1 MMOL/ML IV SOLN COMPARISON:  None. FINDINGS: Vertebral arteries: There is diffuse tapering of the right vertebral artery along its entire length, with enhancement on postcontrast images. On time-of-flight images, there is no flow related enhancement of the right vertebral artery. Left vertebral artery is normal on both sequences. Carotid arteries: There is atherosclerotic narrowing of the proximal right internal carotid artery measuring less than 50% by NASCET criteria. On the left there is atherosclerotic narrowing of the proximal internal carotid artery that measures approximately 60 % by NASCET criteria. IMPRESSION: 1. Narrowing of the right vertebral artery along its entire length on postcontrast imaging with no flow related enhancement on time-of-flight imaging. Acuity remains uncertain. 2. Bilateral internal carotid artery atherosclerosis measuring less than 50% on the right and 60% the left by NASCET criteria. Electronically Signed   By: Deatra Robinson M.D.   On: 08/06/2019 20:11   MR BRAIN WO CONTRAST  Result Date: 08/06/2019 CLINICAL DATA:  Dizziness, nonspecific. EXAM: MRI HEAD WITHOUT CONTRAST MRA  HEAD WITHOUT CONTRAST TECHNIQUE: Multiplanar, multiecho pulse sequences of the brain and surrounding structures were obtained without intravenous contrast. Angiographic images of the head were obtained using MRA technique without contrast. COMPARISON:  Head CT 08/06/2019 FINDINGS: MRI HEAD FINDINGS Brain: Mild intermittent motion degradation. Cerebral volume is normal for age. There is a large region of restricted diffusion within the right cerebellum within the right PICA territory consistent with acute infarction. There is vague corresponding T2/FLAIR hyperintensity at this site. Mild scattered T2/FLAIR hyperintensity within the cerebral white matter is nonspecific, but consistent with chronic small vessel ischemic disease. There are small chronic thalamic lacunar infarcts bilaterally. A small chronic infarct is also present within the right cerebellum. No evidence of intracranial mass. No chronic intracranial blood products. No extra-axial fluid collection. No midline shift. Incidentally noted cavum septum pellucidum and cavum vergae. Vascular: Suspected right frontal lobe developmental venous anomaly. Otherwise reported below. Skull and upper cervical spine: No focal marrow lesion. Sinuses/Orbits: Visualized orbits show no acute finding. Mild ethmoid sinus mucosal thickening. Trace fluid within right mastoid air cells. MRA HEAD FINDINGS The intracranial internal carotid arteries are patent. The M1 middle cerebral arteries are patent without significant stenosis. No M2 proximal branch occlusion or high-grade proximal stenosis is identified. Multifocal mild-to-moderate atherosclerotic irregularity of M2 and more distal MCA branch vessels bilaterally. The anterior cerebral arteries are patent. No intracranial aneurysm is identified. No  flow related signal is appreciated within the distal cervical right vertebral artery. The proximal V4 right vertebral artery is markedly diminutive and irregular in appearance  suggestive of high-grade stenosis and/or partial occlusion. The right PICA is poorly delineated and likely occluded. The intracranial left vertebral artery is patent without significant stenosis, as is the basilar artery. The posterior cerebral arteries are patent proximally without significant stenosis. These results were called by telephone at the time of interpretation on 08/06/2019 at 5:30 pm to provider Dr. Juleen ChinaKohut, Who verbally acknowledged these results. IMPRESSION: MRI brain: 1. Large acute infarct within the right cerebellar hemisphere within the right PICA territory. 2. Mild chronic small vessel ischemic changes within the cerebral white matter. 3. Chronic small-vessel infarcts within the thalami bilaterally and right cerebellum. MRA head: Absence of appreciable flow-related signal within the distal right cervical vertebral artery. Additionally, the proximal intracranial right vertebral artery is markedly irregular and diminutive in appearance suggestive of high-grade stenosis and/or partial occlusion. The right PICA is poorly delineated and likely occluded. CTA neck may be obtained for further characterization of these findings and to assess for possible right vertebral artery dissection. Electronically Signed   By: Jackey LogeKyle  Golden DO   On: 08/06/2019 17:32     Scheduled Meds: .  stroke: mapping our early stages of recovery book   Does not apply Once  . aspirin EC  81 mg Oral QHS  . [START ON 08/08/2019] clopidogrel  75 mg Oral Daily  . heparin  5,000 Units Subcutaneous Q8H  . pravastatin  20 mg Oral QHS   Continuous Infusions: . sodium chloride 50 mL/hr at 08/06/19 2019     LOS: 1 day   Time Spent in minutes   45 minutes  Teaghan Formica D.O. on 08/07/2019 at 12:09 PM  Between 7am to 7pm - Please see pager noted on [email or web address]  After 7pm go to [email or web address]  And look for the night coverage person covering for me after hours  [facility] Office  [phone]

## 2019-08-07 NOTE — Progress Notes (Signed)
Physical Therapy Treatment Patient Details Name: Gerald Taylor MRN: [id number] DOB: 1955/02/16 Today's Date: 08/07/2019    History of Present Illness Pt is a 64 y.o. male with PMH of anemia, CKD, GERD, HTN. and SOB with exertion. Presented to ED with dizziness or vertigo, double vision, as well some nausea, no vomiting and mild headache. MRI brain which was significant for right cerebellar infarct, as well MRA head was significant for poor vertebral circulation.    PT Comments    The pt was attempting to get out of bed without assist of staff, so this opportunity was utilized to further progress the pt's OOB transfers and ambulation. He continues to demo significant deficits in coordination and stability in standing, requiring modA of 2 to maintain upright position this afternoon. He was again able to complete a short bout of lateral walking, limited this afternoon by fatigue rather than dizziness like this morning. The pt and his wife were educated at length on importance of calling for staff assist to get OOB due to the pt's deficits and safety risk. The pt will continue to benefit from skilled PT to progress functional stability with mobility, strength and coordination in his BLE, and increased activity tolerance prior to return home in order to return to his prior level of function and independence.     Follow Up Recommendations  CIR     Equipment Recommendations   (defer to post acute)    Recommendations for Other Services Rehab consult     Precautions / Restrictions Precautions Precautions: Fall Precaution Comments: watch BP, dizzy with all mobility will benefit from visual targets Restrictions Weight Bearing Restrictions: No    Mobility  Bed Mobility Overal bed mobility: Modified Independent             General bed mobility comments: increased time and effort to get to EOB from supine, but no assist needed  Transfers Overall transfer level: Needs  assistance Equipment used: 2 person hand held assist Transfers: Sit to/from Stand Sit to Stand: +2 physical assistance;+2 safety/equipment;Mod assist         General transfer comment: pt able to stand x3 but continues to benefit from modA through HHA to stabilize in standing position. Pt with significant sway in standing, poor ability to make adjustments without cues  Ambulation/Gait Ambulation/Gait assistance: Mod assist;+2 safety/equipment Gait Distance (Feet): 5 Feet Assistive device: 2 person hand held assist Gait Pattern/deviations: Step-to pattern;Ataxic   Gait velocity interpretation: <1.31 ft/sec, indicative of household ambulator General Gait Details: pt with short ataxic steps with need for modA to maintain upright due to significant instability, pt declined further mobility due to fatigue at this time   Modified Rankin (Stroke Patients Only) Modified Rankin (Stroke Patients Only) Pre-Morbid Rankin Score: No symptoms Modified Rankin: Moderately severe disability     Balance Overall balance assessment: Needs assistance Sitting-balance support: Feet supported;Single extremity supported Sitting balance-Leahy Scale: Good Sitting balance - Comments: pt sitting EOB upon arrival of PT with no support, no evidence of instability Postural control: Left lateral lean Standing balance support: Bilateral upper extremity supported Standing balance-Leahy Scale: Poor Standing balance comment: relied on BUE support in standing and modA of 2                            Cognition Arousal/Alertness: Awake/alert Behavior During Therapy: Restless;Impulsive Overall Cognitive Status: Impaired/Different from baseline Area of Impairment: Attention;Following commands;Problem solving  Current Attention Level: Sustained   Following Commands: Follows one step commands with increased time     Problem Solving: Requires verbal cues General Comments: Pt  much more restless and impulsive at this time, upon PT arrival he was attempting to get out of bed by himself with bed alarm going off. He has decreased insight to his deficits and need for assist, continues to use jokes to cover/distract from deficits.      Exercises Other Exercises Other Exercises: Pt is Lt eye dominant.  Initially planned to partially occlude lenses due to his c/o diplopia and closing one eye frequently.  However, after repeated questioning with he and wife, it appears that pt closes one eye frequently and does so at home.  Upon further questioning, pt reports he closes one eye to eliminate glare.  Pt provided with sunglasses to reduce glare    General Comments General comments (skin integrity, edema, etc.): pt reports significant resolution of dizziness this afternoon, but was attempting to get out of bed without staff assist upon arrival of PT. Pt and his wife educated at length about importance of safety in the acute setting and multiple ways to contact staff      Pertinent Vitals/Pain Pain Assessment: No/denies pain Faces Pain Scale: Hurts little more Pain Location: head  Pain Descriptors / Indicators: Headache Pain Intervention(s): Monitored during session    Home Living Family/patient expects to be discharged to:: Private residence Living Arrangements: Spouse/significant other Available Help at Discharge: Family;Available 24 hours/day Type of Home: House Home Access: Level entry   Home Layout: One level Home Equipment: None      Prior Function Level of Independence: Independent      Comments: pt completely independent with mobility, driving, but retired prior to onset of sx   PT Goals (current goals can now be found in the care plan section) Acute Rehab PT Goals Patient Stated Goal: return to independence PT Goal Formulation: With patient Time For Goal Achievement: 08/21/19 Potential to Achieve Goals: Good Progress towards PT goals: Progressing toward  goals    Frequency    Min 4X/week      PT Plan Current plan remains appropriate    Co-evaluation PT/OT/SLP Co-Evaluation/Treatment: Yes Reason for Co-Treatment: For patient/therapist safety;Complexity of the patient's impairments (multi-system involvement) PT goals addressed during session: Mobility/safety with mobility;Balance OT goals addressed during session: ADL's and self-care;Strengthening/ROM      AM-PAC PT "6 Clicks" Mobility   Outcome Measure  Help needed turning from your back to your side while in a flat bed without using bedrails?: None Help needed moving from lying on your back to sitting on the side of a flat bed without using bedrails?: None Help needed moving to and from a bed to a chair (including a wheelchair)?: A Lot Help needed standing up from a chair using your arms (e.g., wheelchair or bedside chair)?: A Lot Help needed to walk in hospital room?: A Lot Help needed climbing 3-5 steps with a railing? : A Lot 6 Click Score: 16    End of Session Equipment Utilized During Treatment: Gait belt Activity Tolerance: Patient tolerated treatment well;Patient limited by fatigue Patient left: in bed;with family/visitor present;with call bell/phone within reach;with bed alarm set Nurse Communication: Mobility status PT Visit Diagnosis: Unsteadiness on feet (R26.81);Difficulty in walking, not elsewhere classified (R26.2);Dizziness and giddiness (R42)     Time: 2706-2376 PT Time Calculation (min) (ACUTE ONLY): 10 min  Charges:  $Gait Training: 8-22 mins  Rolm Baptise, PT, DPT   Acute Rehabilitation Department Pager #: [phone]   Gaetana Michaelis 08/07/2019, 4:48 PM

## 2019-08-07 NOTE — Progress Notes (Signed)
Occupational Therapy Progress Note  This pm, pt adamantly denies diplopia despite reporting he had it earlier.  He does tend to close one eye, but per he and wife this is due to lighting glare.   He was provided with sunglasses to reduce glare, and hopefully reduce headaches.    Continue to recommend CIR.    08/07/19 1500  OT Visit Information  Last OT Received On 08/07/19  Assistance Needed +2  History of Present Illness Pt is a 64 y.o. male with PMH of anemia, CKD, GERD, HTN. and SOB with exertion. Presented to ED with dizziness or vertigo, double vision, as well some nausea, no vomiting and mild headache. MRI brain which was significant for right cerebellar infarct, as well MRA head was significant for poor vertebral circulation.  Precautions  Precautions Fall  Precaution Comments watch BP, dizzy with all mobility will benefit from visual targets  Pain Assessment  Pain Assessment Faces  Faces Pain Scale 4  Pain Location head   Pain Descriptors / Indicators Headache  Pain Intervention(s) Monitored during session  Cognition  Arousal/Alertness Awake/alert  Behavior During Therapy WFL for tasks assessed/performed  Overall Cognitive Status Impaired/Different from baseline  Area of Impairment Attention;Following commands;Problem solving  Current Attention Level Sustained  Following Commands Follows one step commands with increased time  Problem Solving Requires verbal cues  General Comments Pt provides inconsistent responses to questions.  He initially stated he had diplopia, but later insisted that he doesn't.  He frequently self distracts during testing and was unable to sustain attention to complete vision assessment.  He tends to joke in what appears to be an attempt to cover for deficits   Vision- Assessment  Vision Assessment? Yes  Eye Alignment Impaired (comment)  Tracking/Visual Pursuits Other (comment)  Diplopia Assessment Other (comment)  Additional Comments Pt initially  states he has diplopia, but later states he doesn't.  Attempted pursuit assessment, however, pt frequently self distracts and demonstrates difficulty sustaining attention despite cues.  He initially stated he doesn't wear glasses and when attempting to assess acuity, pt indicates he can't read with either eye, then stated he wears reading glasses.  He frequently closes one eye.  With repeated questioning, he (and wife confirms), that he has light/glare sensitivity.    Exercises  Exercises Other exercises  Other Exercises  Other Exercises Pt is Lt eye dominant.  Initially planned to partially occlude lenses due to his c/o diplopia and closing one eye frequently.  However, after repeated questioning with he and wife, it appears that pt closes one eye frequently and does so at home.  Upon further questioning, pt reports he closes one eye to eliminate glare.  Pt provided with sunglasses to reduce glare  OT - End of Session  Activity Tolerance Patient tolerated treatment well  Patient left in bed;with call bell/phone within reach;with bed alarm set;with family/visitor present  OT Assessment/Plan  OT Plan Discharge plan remains appropriate  OT Visit Diagnosis Unsteadiness on feet (R26.81);Other abnormalities of gait and mobility (R26.89);Low vision, both eyes (H54.2);Other symptoms and signs involving the nervous system (R29.898);Pain;Dizziness and giddiness (R42);Cognitive communication deficit (R41.841)  Pain - part of body  (headache )  OT Frequency (ACUTE ONLY) Min 2X/week  Recommendations for Other Services Rehab consult  Follow Up Recommendations CIR  OT Equipment Other (comment)  AM-PAC OT "6 Clicks" Daily Activity Outcome Measure (Version 2)  Help from another person eating meals? 3  Help from another person taking care of personal grooming? 3  Help  from another person toileting, which includes using toliet, bedpan, or urinal? 1  Help from another person bathing (including washing, rinsing,  drying)? 2  Help from another person to put on and taking off regular upper body clothing? 3  Help from another person to put on and taking off regular lower body clothing? 1  6 Click Score 13  OT Goal Progression  Progress towards OT goals Progressing toward goals  OT Time Calculation  OT Start Time (ACUTE ONLY) 1531  OT Stop Time (ACUTE ONLY) 1548  OT Time Calculation (min) 17 min  OT General Charges  $OT Visit 1 Visit  OT Treatments  $Therapeutic Activity 8-22 mins  Eber Jones., OTR/L Acute Rehabilitation Services Pager [phone] Office [phone]

## 2019-08-07 NOTE — Progress Notes (Signed)
  Echocardiogram 2D Echocardiogram has been performed.  Delcie Roch 08/07/2019, 8:54 AM

## 2019-08-07 NOTE — Consult Note (Signed)
Neurology Consultation Reason for Consult: Stroke Referring Physician: Elgergawy, D  CC: Dizziness  History is obtained from: Chart review, patient  HPI: TZVI ECONOMOU is a 64 y.o. male with a history of hypertension, tobacco abuse who presents with dizziness that was present on awakening on Thursday morning.  He states that he had some nausea  as well, but this improved.  He describes the dizziness as vertigo-like sensation.  He denies any numbness or weakness.  Due to the symptoms, he sought care in the independent emergency department where an MRI was obtained which demonstrates moderate cerebellar infarct.   LKW: Wednesday evening tpa given?: no, outside of window    ROS: A 14 point ROS was performed and is negative except as noted in the HPI.   Past Medical History:  Diagnosis Date  . Anemia    hx of anemia as a baby  . Chronic kidney disease    bladder stone   . GERD (gastroesophageal reflux disease)    hx of   . Hypertension   . Shortness of breath    with exertion      Family History  Problem Relation Age of Onset  . CAD Mother   . CAD Father      Social History:  reports that he has been smoking cigarettes. He has a 38.00 pack-year smoking history. He has quit using smokeless tobacco. He reports current alcohol use. He reports that he does not use drugs.   Exam: Current vital signs: BP (!) 179/84 (BP Location: Right Arm)   Pulse 94   Temp 98 F (36.7 C) (Oral)   Resp 20   Ht 5\' 10"  (1.778 m)   Wt (!) 97.1 kg   SpO2 96%   BMI 30.71 kg/m  Vital signs in last 24 hours: Temp:  [98 F (36.7 C)-99.2 F (37.3 C)] 98 F (36.7 C) (07/23 0037) Pulse Rate:  [85-102] 94 (07/23 0037) Resp:  [15-20] 20 (07/22 2304) BP: (147-195)/(81-134) 179/84 (07/23 0037) SpO2:  [93 %-98 %] 96 % (07/23 0037) Weight:  [97.1 kg] 97.1 kg (07/22 1224)   Physical Exam  Constitutional: Appears well-developed and well-nourished.  Psych: Affect appropriate to situation Eyes:  No scleral injection HENT: No OP obstrucion MSK: no joint deformities.  Cardiovascular: Normal rate and regular rhythm.  Respiratory: Effort normal, non-labored breathing GI: Soft.  No distension. There is no tenderness.  Skin: WDI  Neuro: Mental Status: Patient is awake, alert, oriented to person, place, month, year, and situation. Patient is able to give a clear and coherent history. No signs of aphasia or neglect Cranial Nerves: II: Visual Fields are full. Pupils are equal, round, and reactive to light.   III,IV, VI: EOMI without ptosis or diploplia.  V: Facial sensation is symmetric to temperature VII: Facial movement is symmetric.  VIII: hearing is intact to voice X: Uvula elevates symmetrically XI: Shoulder shrug is symmetric. XII: tongue is midline without atrophy or fasciculations.  Motor: Tone is normal. Bulk is normal. 5/5 strength was present in all four extremities.  Sensory: Sensation is symmetric to light touch and temperature in the arms and legs. Cerebellar: He has mild ataxia finger-nose-finger and heel-knee-shin on the right   I have reviewed labs in epic and the results pertinent to this consultation are: Mildly elevated glucose  I have reviewed the images obtained: MRI brain-cerebellar infarct with occluded vertebral.  Impression: 64 year old male presenting with acute cerebellar infarct.  He has been admitted for secondary risk factor modification  and physical therapy.  Recommendations: - HgbA1c, fasting lipid panel - Frequent neuro checks - Echocardiogram - Prophylactic therapy-Antiplatelet med: Aspirin 81 mg and Plavix 75 mg following 300 mg load - Risk factor modification - Telemetry monitoring - PT consult, OT consult, Speech consult - Stroke team to follow   Ritta Slot, MD [facility] [phone]  If 7pm- 7am, please page neurology on call as listed in AMION.

## 2019-08-07 NOTE — Evaluation (Signed)
Occupational Therapy Evaluation Patient Details Name: Gerald Taylor MRN: [id number] DOB: 1955/04/14 Today's Date: 08/07/2019    History of Present Illness Pt is a 64 y.o. male with PMH of anemia, CKD, GERD, HTN. and SOB with exertion. Presented to ED with dizziness or vertigo, double vision, as well some nausea, no vomiting and mild headache. MRI brain which was significant for right cerebellar infarct, as well MRA head was significant for poor vertebral circulation.   Clinical Impression   PTA pt reports being independent and driving. Pt was admitted for above and treated for problem list below (see OT Problem List). Evaluation limited due to increased pain, fatigue, BP, and dizziness. Will need to further assess vision and UE's next session. Requires min guard - Total A +2 for ADLs due to decreased vision and dizziness. Requires Mod A +2 for safety/assist with transfers due to decreased balance and dizziness. Pt reports increased dizziness with positional movement and stating if he stares at things too long, they start to move. BP 184/84 in sitting EOB from supine - MD and RN notified. Believe pt would benefit from skilled OT services acutely and at the CIR level to increase independence and participation in ADLs.    Follow Up Recommendations  CIR    Equipment Recommendations  Other (comment) (TBD at next venue of care)    Recommendations for Other Services Rehab consult     Precautions / Restrictions Precautions Precautions: Fall Precaution Comments: watch BP, dizzy with all mobility will benefit from visual targets Restrictions Weight Bearing Restrictions: No      Mobility Bed Mobility Overal bed mobility: Modified Independent             General bed mobility comments: increased time and effort to get to EOB from supine  Transfers Overall transfer level: Needs assistance Equipment used: 2 person hand held assist Transfers: Sit to/from Stand Sit to Stand: +2 physical  assistance;+2 safety/equipment;Mod assist         General transfer comment: Mod A +2 for sit<>stand x2 with side steps to EOB bed - pt with increased dizziness    Balance Overall balance assessment: Needs assistance Sitting-balance support: Feet supported;Single extremity supported Sitting balance-Leahy Scale: Poor Sitting balance - Comments: relied on single hand support in sitting Postural control: Left lateral lean Standing balance support: Bilateral upper extremity supported Standing balance-Leahy Scale: Poor Standing balance comment: relied on BUE support in standing                           ADL either performed or assessed with clinical judgement   ADL Overall ADL's : Needs assistance/impaired Eating/Feeding: Minimal assistance;Sitting Eating/Feeding Details (indicate cue type and reason): Min A due to vision and coordination Grooming: Sitting;Min guard Grooming Details (indicate cue type and reason): min guard with sitting for EOB activities Upper Body Bathing: Sitting;Minimal assistance Upper Body Bathing Details (indicate cue type and reason): min A for dynamic balance in sitting Lower Body Bathing: Sit to/from stand;Sitting/lateral leans;+2 for physical assistance;Moderate assistance Lower Body Bathing Details (indicate cue type and reason): Mod A +2 for sit<>stand Upper Body Dressing : Minimal assistance;Sitting Upper Body Dressing Details (indicate cue type and reason): Min A for dynamic sitting balance Lower Body Dressing: Total assistance;+2 for physical assistance;Sit to/from stand Lower Body Dressing Details (indicate cue type and reason): Total A +2 for sit to stand and reliance on B UE support Toilet Transfer: +2 for physical assistance;Stand-pivot;BSC;Moderate assistance Toilet Transfer Details (  indicate cue type and reason): Mod A +2 for stand pivot Toileting- Clothing Manipulation and Hygiene: Total assistance;Sit to/from stand;+2 for physical  assistance Toileting - Clothing Manipulation Details (indicate cue type and reason): total A +2 for reliance on BUE supports   Tub/Shower Transfer Details (indicate cue type and reason): Deferred due to pt safety   General ADL Comments: Pt with increased dizziness and nausea with positional change and eye movement. Limited eval due to increased heart rate and dizziness.      Vision Baseline Vision/History: No visual deficits Patient Visual Report: Diplopia;Blurring of vision Vision Assessment?: Vision impaired- to be further tested in functional context Additional Comments: Pt stating increased dizziness with vision. Pt describing seeing things moving while staring at them. Nystagmus and disconjugated gaze present during eval with movement            Pertinent Vitals/Pain Pain Assessment: Faces Faces Pain Scale: Hurts whole lot Pain Location: headache/dizziness Pain Intervention(s): Limited activity within patient's tolerance;Monitored during session;Repositioned     Hand Dominance Right   Extremity/Trunk Assessment Upper Extremity Assessment Upper Extremity Assessment: Overall WFL for tasks assessed (limited eval - will assess for limb ataxia next session)   Lower Extremity Assessment Lower Extremity Assessment: Defer to PT evaluation   Cervical / Trunk Assessment Cervical / Trunk Assessment: Normal   Communication Communication Communication: No difficulties   Cognition Arousal/Alertness: Awake/alert Behavior During Therapy: WFL for tasks assessed/performed Overall Cognitive Status: Within Functional Limits for tasks assessed                                     General Comments  Pt reported increased dizziness with positional change and BP of 184/84 - MD and RN notified            Home Living Family/patient expects to be discharged to:: Private residence Living Arrangements: Spouse/significant other Available Help at Discharge: Family;Available 24  hours/day Type of Home: House Home Access: Level entry     Home Layout: One level     Bathroom Shower/Tub: Chief Strategy Officer: Standard     Home Equipment: None          Prior Functioning/Environment Level of Independence: Independent        Comments: pt completely independent with mobility, driving, but retired prior to onset of sx        OT Problem List: Decreased activity tolerance;Impaired balance (sitting and/or standing);Impaired vision/perception;Decreased coordination;Decreased safety awareness;Decreased knowledge of precautions;Pain      OT Treatment/Interventions: Self-care/ADL training;Therapeutic exercise;Energy conservation;DME and/or AE instruction;Therapeutic activities;Visual/perceptual remediation/compensation;Patient/family education;Balance training    OT Goals(Current goals can be found in the care plan section) Acute Rehab OT Goals Patient Stated Goal: none stated OT Goal Formulation: With patient/family Time For Goal Achievement: 08/21/19 Potential to Achieve Goals: Good  OT Frequency: Min 2X/week           Co-evaluation PT/OT/SLP Co-Evaluation/Treatment: Yes Reason for Co-Treatment: For patient/therapist safety;Complexity of the patient's impairments (multi-system involvement) PT goals addressed during session: Mobility/safety with mobility;Balance OT goals addressed during session: ADL's and self-care;Strengthening/ROM      AM-PAC OT "6 Clicks" Daily Activity     Outcome Measure Help from another person eating meals?: A Little Help from another person taking care of personal grooming?: A Little Help from another person toileting, which includes using toliet, bedpan, or urinal?: Total Help from another person bathing (including washing, rinsing, drying)?: A  Lot Help from another person to put on and taking off regular upper body clothing?: A Little Help from another person to put on and taking off regular lower body  clothing?: Total 6 Click Score: 13   End of Session Equipment Utilized During Treatment: Gait belt Nurse Communication: Mobility status  Activity Tolerance: Patient limited by pain;Patient limited by fatigue Patient left: in bed;with call bell/phone within reach;with bed alarm set;with family/visitor present  OT Visit Diagnosis: Unsteadiness on feet (R26.81);Other abnormalities of gait and mobility (R26.89);Low vision, both eyes (H54.2);Other symptoms and signs involving the nervous system (R29.898);Pain;Dizziness and giddiness (R42) Pain - part of body:  (headache)                Time: 3300-7622 OT Time Calculation (min): 18 min Charges:  OT General Charges $OT Visit: 1 Visit OT Evaluation $OT Eval Moderate Complexity: 1 Mod  Gordon Carlson/OTS  Machelle Raybon 08/07/2019, 2:20 PM

## 2019-08-08 DIAGNOSIS — I63211 Cerebral infarction due to unspecified occlusion or stenosis of right vertebral arteries: Secondary | ICD-10-CM

## 2019-08-08 LAB — CBC
HCT: 44.5 % (ref 39.0–52.0)
Hemoglobin: 14.7 g/dL (ref 13.0–17.0)
MCH: 30.1 pg (ref 26.0–34.0)
MCHC: 33 g/dL (ref 30.0–36.0)
MCV: 91 fL (ref 80.0–100.0)
Platelets: 278 10*3/uL (ref 150–400)
RBC: 4.89 MIL/uL (ref 4.22–5.81)
RDW: 13.9 % (ref 11.5–15.5)
WBC: 13.2 10*3/uL — ABNORMAL HIGH (ref 4.0–10.5)
nRBC: 0 % (ref 0.0–0.2)

## 2019-08-08 LAB — RAPID URINE DRUG SCREEN, HOSP PERFORMED
Amphetamines: NOT DETECTED
Barbiturates: NOT DETECTED
Benzodiazepines: NOT DETECTED
Cocaine: NOT DETECTED
Opiates: NOT DETECTED
Tetrahydrocannabinol: POSITIVE — AB

## 2019-08-08 LAB — LIPID PANEL
Cholesterol: 238 mg/dL — ABNORMAL HIGH (ref 0–200)
HDL: 36 mg/dL — ABNORMAL LOW (ref >40)
LDL Cholesterol: 170 mg/dL — ABNORMAL HIGH (ref 0–99)
Total CHOL/HDL Ratio: 6.6 RATIO
Triglycerides: 158 mg/dL — ABNORMAL HIGH (ref <150)
VLDL: 32 mg/dL (ref 0–40)

## 2019-08-08 LAB — HEMOGLOBIN A1C
Hgb A1c MFr Bld: 5.7 % — ABNORMAL HIGH (ref 4.8–5.6)
Mean Plasma Glucose: 116.89 mg/dL

## 2019-08-08 MED ORDER — MECLIZINE HCL 12.5 MG PO TABS
25.0000 mg | ORAL_TABLET | Freq: Three times a day (TID) | ORAL | Status: DC | PRN
  Administered 2019-08-08 – 2019-08-10 (×4): 25 mg via ORAL
  Filled 2019-08-08 (×4): qty 2

## 2019-08-08 MED ORDER — ATORVASTATIN CALCIUM 80 MG PO TABS
80.0000 mg | ORAL_TABLET | Freq: Every day | ORAL | Status: DC
  Administered 2019-08-08 – 2019-08-10 (×3): 80 mg via ORAL
  Filled 2019-08-08 (×3): qty 1

## 2019-08-08 MED ORDER — ASPIRIN EC 325 MG PO TBEC
325.0000 mg | DELAYED_RELEASE_TABLET | Freq: Every day | ORAL | Status: DC
  Administered 2019-08-08 – 2019-08-09 (×2): 325 mg via ORAL
  Filled 2019-08-08 (×2): qty 1

## 2019-08-08 NOTE — Progress Notes (Signed)
Physical Therapy Treatment Patient Details Name: Gerald Taylor MRN: [id number] DOB: 19-Nov-1955 Today's Date: 08/08/2019    History of Present Illness Pt is a 64 y.o. male with PMH of anemia, CKD, GERD, HTN. and SOB with exertion. Presented to ED with dizziness or vertigo, double vision, as well some nausea, no vomiting and mild headache. MRI brain which was significant for right cerebellar infarct, as well MRA head was significant for poor vertebral circulation.    PT Comments    Pt in bed upon arrival of PT, agreeable to session with continued focus on dynamic stability with mobility progression. The pt was able to demo significant improvements in walking endurance and activity tolerance, but continues to have significant deficits related to balance, coordination, and safety awareness that make him at significant risk of falls. The pt continues to be a great candidate for CIR-level therapies to improve his balance and safety with mobility towards his prior level of function and independence. The pt and his wife expressed concerns about the cost of CIR and therefore we also discussed other avenues of rehab following his acute stay and the importance of continued skilled therapy to progress his functional strength and stability to reduce his risk of fall and injury resulting from a fall.     Follow Up Recommendations  CIR     Equipment Recommendations   (defer to post acute)    Recommendations for Other Services       Precautions / Restrictions Precautions Precautions: Fall Precaution Comments: pt with reduced dizziness today, impulsive Restrictions Weight Bearing Restrictions: No    Mobility  Bed Mobility Overal bed mobility: Modified Independent             General bed mobility comments: increased time and effort to get to EOB from supine, but no assist needed  Transfers Overall transfer level: Needs assistance Equipment used: 1 person hand held assist;Rolling walker (2  wheeled) Transfers: Sit to/from Stand Sit to Stand: Min assist         General transfer comment: pt able to stand from EOB x2 with minA through HHA of 1 or with RW and significant cues for hand placement  Ambulation/Gait Ambulation/Gait assistance: Min assist Gait Distance (Feet): 75 Feet Assistive device: 2 person hand held assist Gait Pattern/deviations: Step-to pattern;Ataxic Gait velocity: 0.16 m/s Gait velocity interpretation: <1.31 ft/sec, indicative of household ambulator General Gait Details: pt with improved ability for ambulation in straight line, able to maintain positioning in RW with improved heel-toe pattern when walking straight, increased difficulty with each turn and change in direction   Stairs             Wheelchair Mobility    Modified Rankin (Stroke Patients Only) Modified Rankin (Stroke Patients Only) Pre-Morbid Rankin Score: No symptoms Modified Rankin: Moderately severe disability     Balance Overall balance assessment: Needs assistance Sitting-balance support: Feet supported;Single extremity supported Sitting balance-Leahy Scale: Good Sitting balance - Comments: pt sitting EOB upon arrival of PT with no support, no evidence of instability   Standing balance support: Bilateral upper extremity supported Standing balance-Leahy Scale: Poor Standing balance comment: relied on BUE support in standing Single Leg Stance - Right Leg: 0.5 Single Leg Stance - Left Leg: 2 Tandem Stance - Right Leg: 8 Tandem Stance - Left Leg: 3       High Level Balance Comments: Pt has significant difficulty with higher level balance that involves SLS, changes in direction, turns, or anything woth cognitive component, requires significant cues  to assume safe position and modA to recover from LOB            Cognition Arousal/Alertness: Awake/alert Behavior During Therapy: Impulsive;WFL for tasks assessed/performed Overall Cognitive Status: Impaired/Different  from baseline Area of Impairment: Attention;Following commands;Problem solving;Safety/judgement                   Current Attention Level: Sustained     Safety/Judgement: Decreased awareness of safety;Decreased awareness of deficits   Problem Solving: Requires verbal cues;Difficulty sequencing General Comments: pt with continued impulsivity, but is able to be redirected. still reduced insight to safety and deficits, need for therapy      Exercises      General Comments General comments (skin integrity, edema, etc.): discussed CIR and OPPT options at length with pt and his wife as they are worried about cost of CIR      Pertinent Vitals/Pain Pain Assessment: No/denies pain           PT Goals (current goals can now be found in the care plan section) Acute Rehab PT Goals Patient Stated Goal: return to independence PT Goal Formulation: With patient Time For Goal Achievement: 08/21/19 Potential to Achieve Goals: Good Progress towards PT goals: Progressing toward goals    Frequency    Min 4X/week      PT Plan Current plan remains appropriate    Co-evaluation              AM-PAC PT "6 Clicks" Mobility   Outcome Measure  Help needed turning from your back to your side while in a flat bed without using bedrails?: None Help needed moving from lying on your back to sitting on the side of a flat bed without using bedrails?: None Help needed moving to and from a bed to a chair (including a wheelchair)?: A Little Help needed standing up from a chair using your arms (e.g., wheelchair or bedside chair)?: A Little Help needed to walk in hospital room?: A Little Help needed climbing 3-5 steps with a railing? : A Lot 6 Click Score: 19    End of Session Equipment Utilized During Treatment: Gait belt Activity Tolerance: Patient tolerated treatment well;Patient limited by fatigue Patient left: in bed;with family/visitor present;with call bell/phone within reach;with  bed alarm set Nurse Communication: Mobility status PT Visit Diagnosis: Unsteadiness on feet (R26.81);Difficulty in walking, not elsewhere classified (R26.2);Dizziness and giddiness (R42)     Time: 7425-9563 PT Time Calculation (min) (ACUTE ONLY): 23 min  Charges:  $Gait Training: 23-37 mins                     Gerald Taylor, PT, DPT   Acute Rehabilitation Department Pager #: [phone]    Gerald Taylor 08/08/2019, 6:15 PM

## 2019-08-08 NOTE — Progress Notes (Signed)
STROKE TEAM PROGRESS NOTE   INTERVAL HISTORY Wife at bedside. Pt sitting in bed for dinner. He feels much better, no dizziness. However, on exam, he still has nystagmus and mild right LE dysmetria. He is educated on smoking cessation and stroke risk factor modification.   Vitals:   08/08/19 0335 08/08/19 0335 08/08/19 0736 08/08/19 1155  BP: (!) 187/91 (!) 187/91 (!) 178/89 (!) 163/99  Pulse: 85 85 83 81  Resp: 18 18 18 20   Temp: 99.1 F (37.3 C) 99.1 F (37.3 C) 98.8 F (37.1 C) 98.1 F (36.7 C)  TempSrc: Oral Oral Oral Oral  SpO2: 96% 96% 97% 98%  Weight:      Height:       CBC:  Recent Labs  Lab 08/06/19 1322 08/08/19 0235  WBC 13.2* 13.2*  HGB 15.4 14.7  HCT 46.7 44.5  MCV 92.7 91.0  PLT 290 278   Basic Metabolic Panel:  Recent Labs  Lab 08/06/19 1322  NA 138  K 3.8  CL 106  CO2 20*  GLUCOSE 145*  BUN 9  CREATININE 0.84  CALCIUM 8.9   Lipid Panel:  Recent Labs  Lab 08/08/19 0235  CHOL 238*  TRIG 158*  HDL 36*  CHOLHDL 6.6  VLDL 32  LDLCALC 08/10/19*   HgbA1c:  Recent Labs  Lab 08/08/19 0235  HGBA1C 5.7*   Urine Drug Screen:  Recent Labs  Lab 08/08/19 1103  LABOPIA NONE DETECTED  COCAINSCRNUR NONE DETECTED  LABBENZ NONE DETECTED  AMPHETMU NONE DETECTED  THCU POSITIVE*  LABBARB NONE DETECTED    Alcohol Level No results for input(s): ETH in the last 168 hours.  IMAGING past 24 hours No results found.  PHYSICAL EXAM Pleasant middle-age Caucasian male who appears quite sick and nauseous while sitting up. . Afebrile. Head is nontraumatic. Neck is supple without bruit.    Cardiac exam no murmur or gallop. Lungs are clear to auscultation. Distal pulses are well felt.  Neurological Exam : Is awake alert oriented time place and person.  Speech and language appear normal.  Extraocular movements are full range with sustained nystagmus on bilateral gaze bidirectional with down beating component with left gaze.  Face is symmetric.  Palate elevates  normally.  Tongue midline.  Motor system exam shows symmetric upper and lower extremity strength without any focal weakness.  FTN intact bilateral but HTS dysmetria on the right. Sensation symmetrical. Gait not tested.     ASSESSMENT/PLAN [name] is a 64 y.o. male with history of HTN, tobacco use presenting with dizziness on awakening Thursday.   Stroke:   moderate R cerebellar infarct in setting of R VA and PICA occlusion, likely secondary to large vessel disease source  CT head No acute abnormality.   MRI  moderate R cerebellar infarct. Old B thalamic and R cerebellar infarcts.  MRA head  Distal R cervical VA slow flow. R VA irregular and small, likely high-grade stenosis. R PICA likely occluded.   MRA neck R VA narrowing w/ no flow seen. R ICA 50%, L ICA 50% stenoses.  2D Echo EF 60-65%. No source of embolus   LDL - 170  HgbA1c - 5.7  UDS + THC  VTE prophylaxis - Heparin 5000 units sq tid   aspirin 81 mg daily prior to admission, now on aspirin 325 mg daily and clopidogrel 75 mg daily following load. Continue DAPT x 3 months then plavix alone.    Therapy recommendations:  CIR  Disposition:  pending  Hypertension  Stable on the high side . Permissive hypertension (OK if < 220/120) but gradually normalize in 3-5 days . Long-term BP goal normotensive  Hyperlipidemia  Home meds:  pravachol 20 + fish oil, pravachol resumed in hospital  LDL 170, goal < 70  Now on Lipitor increased to 80 mg daily  Continue statin at discharge  Tobacco abuse  Current smoker  Smoking cessation counseling provided  Pt is willing to quit  Other Stroke Risk Factors  ETOH use, advised to drink no more than 2 drink(s) a day  Obesity, Body mass index is 30.71 kg/m., recommend weight loss, diet and exercise as appropriate   THC use - UDS positive for THC, cessation education provided  Other Active Problems  GERD  Leukocytosis 13.2   Hospital day # 2  Neurology  will sign off. Please call with questions. Pt will follow up with stroke clinic NP at [facility] in about 4 weeks. Thanks for the consult.  Marvel Plan, MD PhD Stroke Neurology 08/08/2019 8:12 PM   To contact Stroke Continuity provider, please refer to [email or web address]. After hours, contact General Neurology

## 2019-08-08 NOTE — Progress Notes (Signed)
PROGRESS NOTE    CREGG JUTTE  JOI:[id number] DOB: [birth year] DOA: 08/06/2019 PCP: Assunta Found, MD   Brief Narrative:  HPI on 08/06/2019 by Dr. Huey Bienenstock Benn Tarver  is a 64 y.o. male, with past medical history of hypertension, GERD, chronic renal insufficiency, patient presents to ED for evaluation of dizziness or vertigo, double vision, as well some nausea, no vomiting and mild headache, he reports his headache has resolved by now, but the symptoms were present upon him waking up from sleep this morning, reports vertigo exacerbated by him trying to move, he denies any tingling or numbness in his extremities, he denies any weakness or focal deficits in his extremities, he denies any such previous symptoms in the past. - in ED patient had MRI brain which was significant for right cerebellar infarct, as well MRA head was significant for poor vertebral circulation, patient received full dose aspirin in ED, ED physician discussed with nausea at [facility] who requested [place] to [facility] as patient high risk for edema given the size of infarct in the cerebral area, [facility] hospitalist were consulted to admit. Assessment & Plan   Acute CVA -Patient presented with dizziness, vertigo -CT head showed no acute intracranial abnormality -MRI brain showed large acute infarct within the right cerebellar hemisphere within the right PICA territory. -MRA head: Absence of appreciable flow-related signal on the distal right cervical vertebral artery.  Proximal intracranial right vertebral artery markedly irregular and diminutive in appearance-high-grade stenosis and/or partial occlusion. -MRA neck showed narrowing of the right vertebral artery.  Bilateral ICA atherosclerosis measuring less than 50% on the right and 50% on the left -Echocardiogram pending -Hemoglobin A1c 5.7, LDL 170  -Neurology consulted and appreciated- recommended aspirin and plavix for 3 months, followed by aspirin  -PT, OT  recommended CIR -Continue aspirin, plavix, statin  Essential hypertension -Allow for permissive hypertension given acute CVA  Tobacco abuse -continue nicotine patch  Leukocytosis -Suspect reactive given acute CVA -UA unremarkable for infection, patient with no upper respiratory issues -Currently afebrile -Continue to monitor CBC  DVT Prophylaxis  heparin  Code Status: Full  Family Communication: none at bedside  Disposition Plan:  Status is: Inpatient  Remains inpatient appropriate because:Ongoing diagnostic testing needed not appropriate for outpatient work up. Pending CIR consult.   Dispo: The patient is from: Home              Anticipated d/c is to: CIR              Anticipated d/c date is: 2 days              Patient currently is not medically stable to d/c.   Consultants Neurology  Procedures  Echocardiogram  Antibiotics   Anti-infectives (From admission, onward)   None      Subjective:   [name] seen and examined today.  Continues to have some nausea and vomiting occasionally however this morning stated he had none.  Felt extremely tired.  Is concerned that he has no insurance.  Denies chest pain or shortness of breath, abdominal pain. Objective:   Vitals:   08/08/19 0335 08/08/19 0335 08/08/19 0736 08/08/19 1155  BP: (!) 187/91 (!) 187/91 (!) 178/89 (!) 163/99  Pulse: 85 85 83 81  Resp: 18 18 18 20   Temp: 99.1 F (37.3 C) 99.1 F (37.3 C) 98.8 F (37.1 C) 98.1 F (36.7 C)  TempSrc: Oral Oral Oral Oral  SpO2: 96% 96% 97% 98%  Weight:  Height:        Intake/Output Summary (Last 24 hours) at 08/08/2019 1245 Last data filed at 08/08/2019 1150 Gross per 24 hour  Intake 1384.14 ml  Output 850 ml  Net 534.14 ml   Filed Weights   08/06/19 1224  Weight: (!) 97.1 kg   Exam  General: Well developed, well nourished, NAD, appears stated age  HEENT: NCAT, mucous membranes moist.   Cardiovascular: S1 S2 auscultated, RRR, no  murmur  Respiratory: Clear to auscultation bilaterally with equal chest rise  Abdomen: Soft, nontender, nondistended, + bowel sounds  Extremities: warm dry without cyanosis clubbing or edema  Neuro: AAOx3, nonfocal  Psych: Flat  Data Reviewed: I have personally reviewed following labs and imaging studies  CBC: Recent Labs  Lab 08/06/19 1322 08/08/19 0235  WBC 13.2* 13.2*  HGB 15.4 14.7  HCT 46.7 44.5  MCV 92.7 91.0  PLT 290 278   Basic Metabolic Panel: Recent Labs  Lab 08/06/19 1322  NA 138  K 3.8  CL 106  CO2 20*  GLUCOSE 145*  BUN 9  CREATININE 0.84  CALCIUM 8.9   GFR: Estimated Creatinine Clearance: 105.2 mL/min (by C-G formula based on SCr of 0.84 mg/dL). Liver Function Tests: Recent Labs  Lab 08/06/19 1322  AST 23  ALT 24  ALKPHOS 52  BILITOT 0.4  PROT 7.6  ALBUMIN 4.3   No results for input(s): LIPASE, AMYLASE in the last 168 hours. No results for input(s): AMMONIA in the last 168 hours. Coagulation Profile: No results for input(s): INR, PROTIME in the last 168 hours. Cardiac Enzymes: No results for input(s): CKTOTAL, CKMB, CKMBINDEX, TROPONINI in the last 168 hours. BNP (last 3 results) No results for input(s): PROBNP in the last 8760 hours. HbA1C: Recent Labs    08/08/19 0235  HGBA1C 5.7*   CBG: No results for input(s): GLUCAP in the last 168 hours. Lipid Profile: Recent Labs    08/08/19 0235  CHOL 238*  HDL 36*  LDLCALC 170*  TRIG 158*  CHOLHDL 6.6   Thyroid Function Tests: No results for input(s): TSH, T4TOTAL, FREET4, T3FREE, THYROIDAB in the last 72 hours. Anemia Panel: No results for input(s): VITAMINB12, FOLATE, FERRITIN, TIBC, IRON, RETICCTPCT in the last 72 hours. Urine analysis:    Component Value Date/Time   COLORURINE YELLOW 08/07/2019 0842   APPEARANCEUR CLEAR 08/07/2019 0842   LABSPEC 1.019 08/07/2019 0842   PHURINE 6.0 08/07/2019 0842   GLUCOSEU NEGATIVE 08/07/2019 0842   HGBUR MODERATE (A) 08/07/2019 0842    BILIRUBINUR NEGATIVE 08/07/2019 0842   KETONESUR NEGATIVE 08/07/2019 0842   PROTEINUR NEGATIVE 08/07/2019 0842   NITRITE NEGATIVE 08/07/2019 0842   LEUKOCYTESUR NEGATIVE 08/07/2019 0842   Sepsis Labs: (procalcitonin:4,lacticidven:4)  ) Recent Results (from the past 240 hour(s))  SARS Coronavirus 2 by RT PCR (hospital order, performed in [facility] hospital lab) Nasopharyngeal Nasopharyngeal Swab     Status: None   Collection Time: 08/06/19  7:29 PM   Specimen: Nasopharyngeal Swab  Result Value Ref Range Status   SARS Coronavirus 2 NEGATIVE NEGATIVE Final    Comment: (NOTE) SARS-CoV-2 target nucleic acids are NOT DETECTED.  The SARS-CoV-2 RNA is generally detectable in upper and lower respiratory specimens during the acute phase of infection. The lowest concentration of SARS-CoV-2 viral copies this assay can detect is 250 copies / mL. A negative result does not preclude SARS-CoV-2 infection and should not be used as the sole basis for treatment or other patient management decisions.  A negative result  may occur with improper specimen collection / handling, submission of specimen other than nasopharyngeal swab, presence of viral mutation(s) within the areas targeted by this assay, and inadequate number of viral copies (<250 copies / mL). A negative result must be combined with clinical observations, patient history, and epidemiological information.  Fact Sheet for Patients:   BoilerBrush.com.cy  Fact Sheet for Healthcare Providers: https://pope.com/  This test is not yet approved or  cleared by the [place] FDA and has been authorized for detection and/or diagnosis of SARS-CoV-2 by FDA under an Emergency Use Authorization (EUA).  This EUA will remain in effect (meaning this test can be used) for the duration of the COVID-19 declaration under Section 564(b)(1) of the Act, 21 U.S.C. section 360bbb-3(b)(1), unless  the authorization is terminated or revoked sooner.  Performed at [facility], [address] [place], [place] [address]       Radiology Studies: CT Head Wo Contrast  Result Date: 08/06/2019 CLINICAL DATA:  Dizziness EXAM: CT HEAD WITHOUT CONTRAST TECHNIQUE: Contiguous axial images were obtained from the base of the skull through the vertex without intravenous contrast. COMPARISON:  None. FINDINGS: Brain: There is no acute intracranial hemorrhage, mass effect, or edema. Gray-white differentiation is preserved. There is no extra-axial fluid collection. Ventricles and sulci are within normal limits in size and configuration. Minimal patchy hypoattenuation in the supratentorial white matter is nonspecific but may reflect minor chronic microvascular ischemic changes. Vascular: There is atherosclerotic calcification at the skull base. Skull: Calvarium is unremarkable. Sinuses/Orbits: No acute finding. Other: None. IMPRESSION: No acute intracranial abnormality. Electronically Signed   By: Guadlupe Spanish M.D.   On: 08/06/2019 14:26   MR ANGIO HEAD WO CONTRAST  Result Date: 08/06/2019 CLINICAL DATA:  Dizziness, nonspecific. EXAM: MRI HEAD WITHOUT CONTRAST MRA HEAD WITHOUT CONTRAST TECHNIQUE: Multiplanar, multiecho pulse sequences of the brain and surrounding structures were obtained without intravenous contrast. Angiographic images of the head were obtained using MRA technique without contrast. COMPARISON:  Head CT 08/06/2019 FINDINGS: MRI HEAD FINDINGS Brain: Mild intermittent motion degradation. Cerebral volume is normal for age. There is a large region of restricted diffusion within the right cerebellum within the right PICA territory consistent with acute infarction. There is vague corresponding T2/FLAIR hyperintensity at this site. Mild scattered T2/FLAIR hyperintensity within the cerebral white matter is nonspecific, but consistent with chronic small vessel ischemic disease. There are small chronic  thalamic lacunar infarcts bilaterally. A small chronic infarct is also present within the right cerebellum. No evidence of intracranial mass. No chronic intracranial blood products. No extra-axial fluid collection. No midline shift. Incidentally noted cavum septum pellucidum and cavum vergae. Vascular: Suspected right frontal lobe developmental venous anomaly. Otherwise reported below. Skull and upper cervical spine: No focal marrow lesion. Sinuses/Orbits: Visualized orbits show no acute finding. Mild ethmoid sinus mucosal thickening. Trace fluid within right mastoid air cells. MRA HEAD FINDINGS The intracranial internal carotid arteries are patent. The M1 middle cerebral arteries are patent without significant stenosis. No M2 proximal branch occlusion or high-grade proximal stenosis is identified. Multifocal mild-to-moderate atherosclerotic irregularity of M2 and more distal MCA branch vessels bilaterally. The anterior cerebral arteries are patent. No intracranial aneurysm is identified. No flow related signal is appreciated within the distal cervical right vertebral artery. The proximal V4 right vertebral artery is markedly diminutive and irregular in appearance suggestive of high-grade stenosis and/or partial occlusion. The right PICA is poorly delineated and likely occluded. The intracranial left vertebral artery is patent without significant stenosis, as is the  basilar artery. The posterior cerebral arteries are patent proximally without significant stenosis. These results were called by telephone at the time of interpretation on 08/06/2019 at 5:30 pm to provider Dr. Juleen China, Who verbally acknowledged these results. IMPRESSION: MRI brain: 1. Large acute infarct within the right cerebellar hemisphere within the right PICA territory. 2. Mild chronic small vessel ischemic changes within the cerebral white matter. 3. Chronic small-vessel infarcts within the thalami bilaterally and right cerebellum. MRA head: Absence  of appreciable flow-related signal within the distal right cervical vertebral artery. Additionally, the proximal intracranial right vertebral artery is markedly irregular and diminutive in appearance suggestive of high-grade stenosis and/or partial occlusion. The right PICA is poorly delineated and likely occluded. CTA neck may be obtained for further characterization of these findings and to assess for possible right vertebral artery dissection. Electronically Signed   By: Jackey Loge DO   On: 08/06/2019 17:32   MR Angiogram Neck W or Wo Contrast  Result Date: 08/06/2019 CLINICAL DATA:  Dizziness and nausea. EXAM: MRA NECK WITHOUT AND WITH CONTRAST TECHNIQUE: Multiplanar and multiecho pulse sequences of the neck were obtained without and with intravenous contrast. Angiographic images of the neck were obtained using MRA technique without and with intravenous contrast. No fat-suppressed T1-weighted sequence was performed. CONTRAST:  6mL GADAVIST GADOBUTROL 1 MMOL/ML IV SOLN COMPARISON:  None. FINDINGS: Vertebral arteries: There is diffuse tapering of the right vertebral artery along its entire length, with enhancement on postcontrast images. On time-of-flight images, there is no flow related enhancement of the right vertebral artery. Left vertebral artery is normal on both sequences. Carotid arteries: There is atherosclerotic narrowing of the proximal right internal carotid artery measuring less than 50% by NASCET criteria. On the left there is atherosclerotic narrowing of the proximal internal carotid artery that measures approximately 60 % by NASCET criteria. IMPRESSION: 1. Narrowing of the right vertebral artery along its entire length on postcontrast imaging with no flow related enhancement on time-of-flight imaging. Acuity remains uncertain. 2. Bilateral internal carotid artery atherosclerosis measuring less than 50% on the right and 60% the left by NASCET criteria. Electronically Signed   By: Deatra Robinson M.D.   On: 08/06/2019 20:11   MR BRAIN WO CONTRAST  Result Date: 08/06/2019 CLINICAL DATA:  Dizziness, nonspecific. EXAM: MRI HEAD WITHOUT CONTRAST MRA HEAD WITHOUT CONTRAST TECHNIQUE: Multiplanar, multiecho pulse sequences of the brain and surrounding structures were obtained without intravenous contrast. Angiographic images of the head were obtained using MRA technique without contrast. COMPARISON:  Head CT 08/06/2019 FINDINGS: MRI HEAD FINDINGS Brain: Mild intermittent motion degradation. Cerebral volume is normal for age. There is a large region of restricted diffusion within the right cerebellum within the right PICA territory consistent with acute infarction. There is vague corresponding T2/FLAIR hyperintensity at this site. Mild scattered T2/FLAIR hyperintensity within the cerebral white matter is nonspecific, but consistent with chronic small vessel ischemic disease. There are small chronic thalamic lacunar infarcts bilaterally. A small chronic infarct is also present within the right cerebellum. No evidence of intracranial mass. No chronic intracranial blood products. No extra-axial fluid collection. No midline shift. Incidentally noted cavum septum pellucidum and cavum vergae. Vascular: Suspected right frontal lobe developmental venous anomaly. Otherwise reported below. Skull and upper cervical spine: No focal marrow lesion. Sinuses/Orbits: Visualized orbits show no acute finding. Mild ethmoid sinus mucosal thickening. Trace fluid within right mastoid air cells. MRA HEAD FINDINGS The intracranial internal carotid arteries are patent. The M1 middle cerebral arteries are patent without significant stenosis.  No M2 proximal branch occlusion or high-grade proximal stenosis is identified. Multifocal mild-to-moderate atherosclerotic irregularity of M2 and more distal MCA branch vessels bilaterally. The anterior cerebral arteries are patent. No intracranial aneurysm is identified. No flow related signal  is appreciated within the distal cervical right vertebral artery. The proximal V4 right vertebral artery is markedly diminutive and irregular in appearance suggestive of high-grade stenosis and/or partial occlusion. The right PICA is poorly delineated and likely occluded. The intracranial left vertebral artery is patent without significant stenosis, as is the basilar artery. The posterior cerebral arteries are patent proximally without significant stenosis. These results were called by telephone at the time of interpretation on 08/06/2019 at 5:30 pm to provider Dr. Juleen China, Who verbally acknowledged these results. IMPRESSION: MRI brain: 1. Large acute infarct within the right cerebellar hemisphere within the right PICA territory. 2. Mild chronic small vessel ischemic changes within the cerebral white matter. 3. Chronic small-vessel infarcts within the thalami bilaterally and right cerebellum. MRA head: Absence of appreciable flow-related signal within the distal right cervical vertebral artery. Additionally, the proximal intracranial right vertebral artery is markedly irregular and diminutive in appearance suggestive of high-grade stenosis and/or partial occlusion. The right PICA is poorly delineated and likely occluded. CTA neck may be obtained for further characterization of these findings and to assess for possible right vertebral artery dissection. Electronically Signed   By: Jackey Loge DO   On: 08/06/2019 17:32   ECHOCARDIOGRAM COMPLETE  Result Date: 08/07/2019    ECHOCARDIOGRAM REPORT   Patient Name:   CHINMAY SQUIER Date of Exam: 08/07/2019 Medical Rec #:  [id number]      Height:       70.0 in Accession #:    [id number]     Weight:       214.0 lb Date of Birth:  08-12-1955     BSA:          2.148 m Patient Age:    63 years       BP:           178/92 mmHg Patient Gender: M              HR:           90 bpm. Exam Location:  Inpatient Procedure: 2D Echo Indications:    stroke 434.91  History:        Patient  has prior history of Echocardiogram examinations, most                 recent 03/04/2017.  Sonographer:    Delcie Roch Referring Phys: [id number] DAWOOD S ELGERGAWY IMPRESSIONS  1. Left ventricular ejection fraction, by estimation, is 60 to 65%. The left ventricle has normal function. The left ventricle has no regional wall motion abnormalities. Left ventricular diastolic parameters were normal.  2. Right ventricular systolic function is normal. The right ventricular size is normal. Tricuspid regurgitation signal is inadequate for assessing PA pressure.  3. The mitral valve is normal in structure. Trivial mitral valve regurgitation.  4. The aortic valve has an indeterminant number of cusps. Aortic valve regurgitation is trivial. Mild aortic valve stenosis.  5. The inferior vena cava is normal in size with <50% respiratory variability, suggesting right atrial pressure of 8 mmHg. Comparison(s): No significant change from prior study. Conclusion(s)/Recommendation(s): No intracardiac source of embolism detected on this transthoracic study. A transesophageal echocardiogram is recommended to exclude cardiac source of embolism if clinically indicated. FINDINGS  Left Ventricle: Left ventricular ejection fraction, by  estimation, is 60 to 65%. The left ventricle has normal function. The left ventricle has no regional wall motion abnormalities. The left ventricular internal cavity size was normal in size. There is  no left ventricular hypertrophy. Left ventricular diastolic parameters were normal. Right Ventricle: The right ventricular size is normal. No increase in right ventricular wall thickness. Right ventricular systolic function is normal. Tricuspid regurgitation signal is inadequate for assessing PA pressure. Left Atrium: Left atrial size was normal in size. Right Atrium: Right atrial size was normal in size. Pericardium: There is no evidence of pericardial effusion. Presence of pericardial fat pad. Mitral Valve: The  mitral valve is normal in structure. Trivial mitral valve regurgitation. Tricuspid Valve: The tricuspid valve is normal in structure. Tricuspid valve regurgitation is trivial. Aortic Valve: The aortic valve has an indeterminant number of cusps. . There is moderate thickening and moderate calcification of the aortic valve. Aortic valve regurgitation is trivial. Mild aortic stenosis is present. There is moderate thickening of the aortic valve. There is moderate calcification of the aortic valve. Aortic valve mean gradient measures 12.0 mmHg. Aortic valve peak gradient measures 22.3 mmHg. Aortic valve area, by VTI measures 1.85 cm. Pulmonic Valve: The pulmonic valve was not well visualized. Pulmonic valve regurgitation is not visualized. Aorta: The aortic root, ascending aorta and aortic arch are all structurally normal, with no evidence of dilitation or obstruction. Venous: The inferior vena cava is normal in size with less than 50% respiratory variability, suggesting right atrial pressure of 8 mmHg. IAS/Shunts: No atrial level shunt detected by color flow Doppler.  LEFT VENTRICLE PLAX 2D LVIDd:         4.80 cm  Diastology LVIDs:         3.20 cm  LV e' lateral:   9.68 cm/s LV PW:         1.00 cm  LV E/e' lateral: 7.7 LV IVS:        0.90 cm  LV e' medial:    8.81 cm/s LVOT diam:     2.00 cm  LV E/e' medial:  8.5 LV SV:         82 LV SV Index:   38 LVOT Area:     3.14 cm  RIGHT VENTRICLE             IVC RV S prime:     15.90 cm/s  IVC diam: 1.80 cm TAPSE (M-mode): 2.4 cm LEFT ATRIUM             Index       RIGHT ATRIUM           Index LA diam:        3.80 cm 1.77 cm/m  RA Area:     14.90 cm LA Vol (A2C):   43.0 ml 20.02 ml/m RA Volume:   37.10 ml  17.27 ml/m LA Vol (A4C):   41.3 ml 19.23 ml/m LA Biplane Vol: 43.4 ml 20.20 ml/m  AORTIC VALVE AV Area (Vmax):    1.67 cm AV Area (Vmean):   1.60 cm AV Area (VTI):     1.85 cm AV Vmax:           236.00 cm/s AV Vmean:          162.500 cm/s AV VTI:            0.445 m  AV Peak Grad:      22.3 mmHg AV Mean Grad:      12.0 mmHg LVOT Vmax:  125.50 cm/s LVOT Vmean:        82.700 cm/s LVOT VTI:          0.262 m LVOT/AV VTI ratio: 0.59  AORTA Ao Root diam: 3.20 cm Ao Asc diam:  3.40 cm MITRAL VALVE MV Area (PHT): 2.87 cm    SHUNTS MV Decel Time: 264 msec    Systemic VTI:  0.26 m MV E velocity: 75.00 cm/s  Systemic Diam: 2.00 cm MV A velocity: 89.10 cm/s MV E/A ratio:  0.84 Jodelle RedBridgette Christopher MD Electronically signed by Jodelle RedBridgette Christopher MD Signature Date/Time: 08/07/2019/12:54:59 PM    Final      Scheduled Meds: .  stroke: mapping our early stages of recovery book   Does not apply Once  . aspirin EC  325 mg Oral QHS  . atorvastatin  80 mg Oral Daily  . clopidogrel  75 mg Oral Daily  . heparin  5,000 Units Subcutaneous Q8H  . nicotine  14 mg Transdermal Daily   Continuous Infusions: . sodium chloride 50 mL/hr at 08/08/19 0000     LOS: 2 days   Time Spent in minutes   30 minutes  Jabriel Vanduyne D.O. on 08/08/2019 at 12:45 PM  Between 7am to 7pm - Please see pager noted on [email or web address]  After 7pm go to [email or web address]  And look for the night coverage person covering for me after hours  [facility] Office  [phone]

## 2019-08-08 NOTE — Plan of Care (Signed)
  Problem: Education: Goal: Knowledge of secondary prevention will improve Outcome: Progressing   Problem: Education: Goal: Knowledge of General Education information will improve Description: Including pain rating scale, medication(s)/side effects and non-pharmacologic comfort measures Outcome: Progressing   Problem: Pain Managment: Goal: General experience of comfort will improve Outcome: Progressing

## 2019-08-08 NOTE — Progress Notes (Signed)
Inpatient Rehab Admissions Coordinator:   Met with patient at bedside to discuss potential CIR admission. Pt. Stated interest but is uninsured and concerned about cost. Would like to speak with financial counselor prior to making a decision. . Will pursue for potential admit next week, pending bed availability  Clemens Catholic, [place], [place] Admissions Coordinator  [phone] (celll) [phone] (office)

## 2019-08-09 DIAGNOSIS — E782 Mixed hyperlipidemia: Secondary | ICD-10-CM

## 2019-08-09 DIAGNOSIS — F17200 Nicotine dependence, unspecified, uncomplicated: Secondary | ICD-10-CM

## 2019-08-09 DIAGNOSIS — F1210 Cannabis abuse, uncomplicated: Secondary | ICD-10-CM

## 2019-08-09 MED ORDER — CALCIUM CARBONATE ANTACID 500 MG PO CHEW
1.0000 | CHEWABLE_TABLET | Freq: Once | ORAL | Status: AC
  Administered 2019-08-09: 200 mg via ORAL
  Filled 2019-08-09: qty 1

## 2019-08-09 MED ORDER — METOPROLOL TARTRATE 5 MG/5ML IV SOLN
2.5000 mg | Freq: Once | INTRAVENOUS | Status: AC
  Administered 2019-08-09: 2.5 mg via INTRAVENOUS
  Filled 2019-08-09: qty 5

## 2019-08-09 NOTE — Progress Notes (Signed)
Pt. was standing in restroom with nurse and wife present and attempting to pull pants down and pt. leaned over too far and went down on knee. Pt. stated he hit his head on walker but not witnessed. Pt. able to get self back up on commode. VS obtained: BP-177/89 (115), P-83, R-22, Temp-98.1, O2 sat-99% RA. MD notified. MD stated to monitor pt. for any changes but no concerns at this time.

## 2019-08-09 NOTE — Progress Notes (Addendum)
PROGRESS NOTE    Gerald Taylor  IEP:[id number] DOB: [birth year] DOA: 08/06/2019 PCP: Assunta Found, MD   Brief Narrative:  HPI on 08/06/2019 by Dr. Huey Bienenstock Gerald Taylor  is a 64 y.o. male, with past medical history of hypertension, GERD, chronic renal insufficiency, patient presents to ED for evaluation of dizziness or vertigo, double vision, as well some nausea, no vomiting and mild headache, he reports his headache has resolved by now, but the symptoms were present upon him waking up from sleep this morning, reports vertigo exacerbated by him trying to move, he denies any tingling or numbness in his extremities, he denies any weakness or focal deficits in his extremities, he denies any such previous symptoms in the past. - in ED patient had MRI brain which was significant for right cerebellar infarct, as well MRA head was significant for poor vertebral circulation, patient received full dose aspirin in ED, ED physician discussed with nausea at [facility] who requested [place] to [facility] as patient high risk for edema given the size of infarct in the cerebral area, [facility]ist were consulted to admit. Assessment & Plan   Acute CVA -Patient presented with dizziness, vertigo- continue to have these symptoms -CT head showed no acute intracranial abnormality -MRI brain showed large acute infarct within the right cerebellar hemisphere within the right PICA territory. -MRA head: Absence of appreciable flow-related signal on the distal right cervical vertebral artery.  Proximal intracranial right vertebral artery markedly irregular and diminutive in appearance-high-grade stenosis and/or partial occlusion. -MRA neck showed narrowing of the right vertebral artery.  Bilateral ICA atherosclerosis measuring less than 50% on the right and 50% on the left -Echocardiogram EF 60 to 65%, LV diastolic parameters were normal. -Hemoglobin A1c 5.7, LDL 170  -Neurology consulted and appreciated-  recommended aspirin and plavix for 3 months, followed by aspirin  -PT, OT recommended CIR -Continue aspirin, plavix, statin -Continue antiemetics and meclizine as needed  Essential hypertension -Allow for permissive hypertension given acute CVA  Tobacco abuse -continue nicotine patch  Leukocytosis -Suspect reactive given acute CVA -UA unremarkable for infection, patient with no upper respiratory issues -Currently afebrile -Continue to monitor CBC  DVT Prophylaxis  heparin  Code Status: Full  Family Communication: none at bedside. Wife via phone  Disposition Plan:  Status is: Inpatient  Remains inpatient appropriate because:Ongoing diagnostic testing needed not appropriate for outpatient work up. Pending CIR consult.   Dispo: The patient is from: Home              Anticipated d/c is to: CIR              Anticipated d/c date is: 2 days              Patient currently is not medically stable to d/c.   Consultants Neurology  Procedures  Echocardiogram  Antibiotics   Anti-infectives (From admission, onward)   None      Subjective:   Gerald Taylor seen and examined today.  Continues to complain of nausea and dizziness. Does not have much of an appetite.  Denies current chest pain or shortness of breath, abdominal pain, diarrhea, constipation.   Objective:   Vitals:   08/09/19 0005 08/09/19 0420 08/09/19 0500 08/09/19 0732  BP: (!) 169/88 (!) 192/105 (!) 160/105 (!) 161/84  Pulse: 76 71  70  Resp: 18 18 12 20   Temp: 99.4 F (37.4 C) 99.3 F (37.4 C)  98.8 F (37.1 C)  TempSrc: Oral Oral  Oral  SpO2: 93% 98%  97%  Weight:      Height:        Intake/Output Summary (Last 24 hours) at 08/09/2019 1115 Last data filed at 08/09/2019 1048 Gross per 24 hour  Intake 340 ml  Output 425 ml  Net -85 ml   Filed Weights   08/06/19 1224  Weight: (!) 97.1 kg   Exam  General: Well developed, well nourished, NAD, appears stated age  HEENT: NCAT, mucous membranes  moist.   Cardiovascular: S1 S2 auscultated, RRR, no murmur  Respiratory: Clear to auscultation bilaterally  Abdomen: Soft, nontender, nondistended, + bowel sounds  Extremities: warm dry without cyanosis clubbing or edema  Neuro: AAOx3, nonfocal  Psych: Flat  Data Reviewed: I have personally reviewed following labs and imaging studies  CBC: Recent Labs  Lab 08/06/19 1322 08/08/19 0235  WBC 13.2* 13.2*  HGB 15.4 14.7  HCT 46.7 44.5  MCV 92.7 91.0  PLT 290 278   Basic Metabolic Panel: Recent Labs  Lab 08/06/19 1322  NA 138  K 3.8  CL 106  CO2 20*  GLUCOSE 145*  BUN 9  CREATININE 0.84  CALCIUM 8.9   GFR: Estimated Creatinine Clearance: 105.2 mL/min (by C-G formula based on SCr of 0.84 mg/dL). Liver Function Tests: Recent Labs  Lab 08/06/19 1322  AST 23  ALT 24  ALKPHOS 52  BILITOT 0.4  PROT 7.6  ALBUMIN 4.3   No results for input(s): LIPASE, AMYLASE in the last 168 hours. No results for input(s): AMMONIA in the last 168 hours. Coagulation Profile: No results for input(s): INR, PROTIME in the last 168 hours. Cardiac Enzymes: No results for input(s): CKTOTAL, CKMB, CKMBINDEX, TROPONINI in the last 168 hours. BNP (last 3 results) No results for input(s): PROBNP in the last 8760 hours. HbA1C: Recent Labs    08/08/19 0235  HGBA1C 5.7*   CBG: No results for input(s): GLUCAP in the last 168 hours. Lipid Profile: Recent Labs    08/08/19 0235  CHOL 238*  HDL 36*  LDLCALC 170*  TRIG 158*  CHOLHDL 6.6   Thyroid Function Tests: No results for input(s): TSH, T4TOTAL, FREET4, T3FREE, THYROIDAB in the last 72 hours. Anemia Panel: No results for input(s): VITAMINB12, FOLATE, FERRITIN, TIBC, IRON, RETICCTPCT in the last 72 hours. Urine analysis:    Component Value Date/Time   COLORURINE YELLOW 08/07/2019 0842   APPEARANCEUR CLEAR 08/07/2019 0842   LABSPEC 1.019 08/07/2019 0842   PHURINE 6.0 08/07/2019 0842   GLUCOSEU NEGATIVE 08/07/2019 0842    HGBUR MODERATE (A) 08/07/2019 0842   BILIRUBINUR NEGATIVE 08/07/2019 0842   KETONESUR NEGATIVE 08/07/2019 0842   PROTEINUR NEGATIVE 08/07/2019 0842   NITRITE NEGATIVE 08/07/2019 0842   LEUKOCYTESUR NEGATIVE 08/07/2019 0842   Sepsis Labs: @LABRCNTIP (procalcitonin:4,lacticidven:4)  ) Recent Results (from the past 240 hour(s))  SARS Coronavirus 2 by RT PCR (hospital order, performed in [facility] hospital lab) Nasopharyngeal Nasopharyngeal Swab     Status: None   Collection Time: 08/06/19  7:29 PM   Specimen: Nasopharyngeal Swab  Result Value Ref Range Status   SARS Coronavirus 2 NEGATIVE NEGATIVE Final    Comment: (NOTE) SARS-CoV-2 target nucleic acids are NOT DETECTED.  The SARS-CoV-2 RNA is generally detectable in upper and lower respiratory specimens during the acute phase of infection. The lowest concentration of SARS-CoV-2 viral copies this assay can detect is 250 copies / mL. A negative result does not preclude SARS-CoV-2 infection and should not be used as the sole basis for treatment or  other patient management decisions.  A negative result may occur with improper specimen collection / handling, submission of specimen other than nasopharyngeal swab, presence of viral mutation(s) within the areas targeted by this assay, and inadequate number of viral copies (<250 copies / mL). A negative result must be combined with clinical observations, patient history, and epidemiological information.  Fact Sheet for Patients:   BoilerBrush.com.cy  Fact Sheet for Healthcare Providers: https://pope.com/  This test is not yet approved or  cleared by the [place] FDA and has been authorized for detection and/or diagnosis of SARS-CoV-2 by FDA under an Emergency Use Authorization (EUA).  This EUA will remain in effect (meaning this test can be used) for the duration of the COVID-19 declaration under Section 564(b)(1) of the Act, 21  U.S.C. section 360bbb-3(b)(1), unless the authorization is terminated or revoked sooner.  Performed at [facility], [address] [place], [place] [address]       Radiology Studies: No results found.   Scheduled Meds: .  stroke: mapping our early stages of recovery book   Does not apply Once  . aspirin EC  325 mg Oral QHS  . atorvastatin  80 mg Oral Daily  . clopidogrel  75 mg Oral Daily  . heparin  5,000 Units Subcutaneous Q8H  . nicotine  14 mg Transdermal Daily   Continuous Infusions: . sodium chloride 50 mL/hr at 08/08/19 1403     LOS: 3 days   Time Spent in minutes   30 minutes  Neomi Laidler D.O. on 08/09/2019 at 11:15 AM  Between 7am to 7pm - Please see pager noted on [email or web address]  After 7pm go to [email or web address]  And look for the night coverage person covering for me after hours  [facility] Office  [phone]

## 2019-08-09 NOTE — Evaluation (Addendum)
Speech Language Pathology Evaluation Patient Details Name: Gerald Taylor MRN: [id number] DOB: [birth year] Today's Date: 08/09/2019 Time: 2951-8841 SLP Time Calculation (min) (ACUTE ONLY): 33 min  Problem List:  Patient Active Problem List   Diagnosis Date Noted   Acute CVA (cerebrovascular accident) (HCC) 08/06/2019   Past Medical History:  Past Medical History:  Diagnosis Date   Anemia    hx of anemia as a baby   Chronic kidney disease    bladder stone    GERD (gastroesophageal reflux disease)    hx of    Hypertension    Shortness of breath    with exertion    Past Surgical History:  Past Surgical History:  Procedure Laterality Date   APPENDECTOMY     HEMORRHOID SURGERY     HPI:  Pt is a 64 y.o. male with PMH of anemia, CKD, GERD, HTN. and SOB with exertion. Presented to ED with dizziness or vertigo, double vision, as well some nausea, no vomiting and mild headache. MRI brain which was significant for right cerebellar infarct, as well MRA head was significant for poor vertebral circulation.   Assessment / Plan / Recommendation Clinical Impression  Patient was seen for a cognitive-linguistic evaluation in the setting of an acute infarct in the R PICA territory.  Patient was encountered asleep in bed with wife present at bedside.  He roused to moderate verbal stimulation and was agreeable to this evaluation.  Pt reported that he lives with his wife and that he is retired. He managed his IADLs independently at baseline and he stated that he is responsible for the couple's finances.   Wife reported that she would be able to start managing them.  Pt and wife both endorsed that the pt had baseline memory deficits prior to CVA.  He completed the [facility] SLUMS Examination on this date and he scored overall 13/30, indicating a moderate cognitive-linguistic impairment.  Pt exhibited the greatest deficits in short-term/working memory with additional deficits noted in high level numeric  problem solving, attention, and thought organization, with suspected deficits in auditory processing.  Pt had some difficulty with complex receptive language tasks, but expressive language and speech appeared to be functional.  Recommend additional ST 5x weekly, high intensity (CIR) targeting cognitive-linguistic deficits and assistance with IADLs (medications, finances, etc.) at time of discharge.  Pt and wife were educated regarding results/recommendations and they verbalized understanding.  SLP will f/u per POC.      SLP Assessment  SLP Recommendation/Assessment: Patient needs continued Speech Lanaguage Pathology Services SLP Visit Diagnosis: Cognitive communication deficit (R41.841)    Follow Up Recommendations  Inpatient Rehab    Frequency and Duration min 2x/week  2 weeks      SLP Evaluation Cognition  Overall Cognitive Status: Impaired/Different from baseline Arousal/Alertness: Awake/alert Orientation Level: Oriented X4 Attention: Focused;Sustained Focused Attention: Impaired Focused Attention Impairment: Verbal complex Sustained Attention: Impaired Sustained Attention Impairment: Verbal complex Memory: Impaired Memory Impairment: Decreased short term memory;Decreased recall of new information Decreased Short Term Memory: Verbal basic;Functional basic Awareness: Impaired Awareness Impairment: Emergent impairment Problem Solving: Impaired (complex)       Comprehension  Auditory Comprehension Overall Auditory Comprehension: Impaired Yes/No Questions: Impaired Basic Biographical Questions: 76-100% accurate Basic Immediate Environment Questions: 75-100% accurate Complex Questions: 50-74% accurate Conversation: Complex Interfering Components: Working memory Reading Comprehension Reading Status: Within funtional limits    Expression Expression Primary Mode of Expression: Verbal Verbal Expression Overall Verbal Expression: Appears within functional limits for tasks  assessed Initiation: No impairment  Level of Generative/Spontaneous Verbalization: Conversation Repetition: No impairment Naming: No impairment Pragmatics: No impairment Written Expression Dominant Hand: Right   Oral / Motor  Oral Motor/Sensory Function Overall Oral Motor/Sensory Function: Within functional limits Motor Speech Overall Motor Speech: Appears within functional limits for tasks assessed   GO                   Villa Herb M.S., CCC-SLP Acute Rehabilitation Services Office: [phone]  Shanon Rosser [facility] 08/09/2019, 2:54 PM

## 2019-08-09 NOTE — Plan of Care (Signed)
  Problem: Nutrition: Goal: Adequate nutrition will be maintained Outcome: Progressing   Problem: Safety: Goal: Ability to remain free from injury will improve Outcome: Progressing   Problem: Skin Integrity: Goal: Risk for impaired skin integrity will decrease Outcome: Progressing   

## 2019-08-10 MED ORDER — NICOTINE 14 MG/24HR TD PT24: 14.0000 mg | MEDICATED_PATCH | Freq: Every day | TRANSDERMAL | 0 refills | Status: DC

## 2019-08-10 MED ORDER — ONDANSETRON HCL 4 MG PO TABS: 4.0000 mg | ORAL_TABLET | Freq: Every day | ORAL | 1 refills | Status: AC | PRN

## 2019-08-10 MED ORDER — MECLIZINE HCL 25 MG PO TABS: 25.0000 mg | ORAL_TABLET | Freq: Three times a day (TID) | ORAL | 0 refills | Status: AC | PRN

## 2019-08-10 MED ORDER — ATORVASTATIN CALCIUM 80 MG PO TABS: 80.0000 mg | ORAL_TABLET | Freq: Every day | ORAL | 0 refills | Status: AC

## 2019-08-10 MED ORDER — CLOPIDOGREL BISULFATE 75 MG PO TABS: 75.0000 mg | ORAL_TABLET | Freq: Every day | ORAL | 2 refills | Status: AC

## 2019-08-10 MED ORDER — ASPIRIN 325 MG PO TBEC: 325.0000 mg | DELAYED_RELEASE_TABLET | Freq: Every day | ORAL | 3 refills | Status: AC

## 2019-08-10 MED FILL — CLOPIDOGREL 75 MG TABLET: 75 | 30 days supply | Qty: 30 | Fill #0

## 2019-08-10 MED FILL — ONDANSETRON HCL 4 MG TABLET: 4 | 30 days supply | Qty: 30 | Fill #0

## 2019-08-10 MED FILL — MECLIZINE 25 MG TABLET: 25 | 10 days supply | Qty: 30 | Fill #0

## 2019-08-10 MED FILL — ASPIRIN EC 325 MG TABLET: 325 | 30 days supply | Qty: 30 | Fill #0

## 2019-08-10 MED FILL — ATORVASTATIN CALCIUM 80 MG: 80 | 30 days supply | Qty: 30 | Fill #0

## 2019-08-10 NOTE — Discharge Summary (Signed)
Physician Discharge Summary  Gerald MatarJames M Taylor BJY:[id number] DOB: [birth year] DOA: 08/06/2019  PCP: Assunta FoundGolding, [name], MD  Admit date: 08/06/2019 Discharge date: 08/10/2019  Time spent: 45 minutes  Recommendations for Outpatient Follow-up:  Patient will be discharged to home with home health services.  Patient will need to follow up with primary care provider within one week of discharge.  Follow up with neurology. Patient should continue medications as prescribed.  Patient should follow a heart healthy diet.   Discharge Diagnoses:  Acute CVA Essential hypertension Tobacco abuse Leukocytosis  Discharge Condition: stable  Diet recommendation: heart healthy  Filed Weights   08/06/19 1224  Weight: (!) 97.1 kg    History of present illness:  on 08/06/2019 by Dr. Mliss Fritzawood Elgergawy JamesHodgesis a64 y.o.male,with past medical history of hypertension, GERD, chronic renal insufficiency, patient presents to ED for evaluation of dizziness or vertigo, double vision, as well some nausea, no vomiting and mild headache, he reports his headache has resolved by now, but the symptoms were present upon him waking up from sleep this morning, reports vertigo exacerbated by him trying to move, he denies any tingling or numbness in his extremities, he denies any weakness or focal deficits in his extremities,he denies any such previous symptoms in the past. - in EDpatient had MRI brain which was significant for right cerebellar infarct, as well MRA head was significant for poor vertebral circulation, patient received full dose aspirin in ED, ED physician [facility] who [facility] [place] [facility] as patient high risk for edema given the size of infarct in the cerebral area,[facility] were consulted to admit.  Hospital Course:  Acute CVA -Patient presented with dizziness, vertigo- continue to have these symptoms -CT head showed no acute intracranial abnormality -MRI  brain showed large acute infarct within the right cerebellar hemisphere within the right PICA territory. -MRA head: Absence of appreciable flow-related signal on the distal right cervical vertebral artery.  Proximal intracranial right vertebral artery markedly irregular and diminutive in appearance-high-grade stenosis and/or partial occlusion. -MRA neck showed narrowing of the right vertebral artery.  Bilateral ICA atherosclerosis measuring less than 50% on the right and 50% on the left -Echocardiogram EF 60 to 65%, LV diastolic parameters were normal. -Hemoglobin A1c 5.7, LDL 170  -Neurology consulted and appreciated- recommended aspirin and plavix for 3 months, followed by aspirin  -PT, OT recommended CIR -Continue aspirin, plavix, statin -Continue antiemetics and meclizine as needed  Essential hypertension -Allow for permissive hypertension given acute CVA  Tobacco abuse -continue nicotine patch  Leukocytosis -Suspect reactive given acute CVA -UA unremarkable for infection, patient with no upper respiratory issues -Currently afebrile -Continue to monitor CBC  Consultants Neurology  Procedures  Echocardiogram  Discharge Exam: Vitals:   08/10/19 0803 08/10/19 1245  BP: (!) 145/75 (!) 151/80  Pulse: 75 77  Resp: 18 18  Temp: 98 F (36.7 C) 98 F (36.7 C)  SpO2: 98% 96%    Exam  General: Well developed, well nourished, NAD, appears stated age  HEENT: NCAT, mucous membranes moist.   Cardiovascular: S1 S2 auscultated, RRR, no murmur  Respiratory: Clear to auscultation bilaterally  Abdomen: Soft, nontender, nondistended, + bowel sounds  Extremities: warm dry without cyanosis clubbing or edema  Neuro: AAOx3, nonfocal  Psych: Flat   Discharge Instructions Discharge Instructions    Ambulatory referral to Neurology   Complete by: As directed    Follow up with stroke clinic NP (Gerald Taylor or Gerald Taylor, if both not available, consider  Gerald Taylor, or Gerald Taylor) at [facility] in about 4 weeks. Thanks.   Discharge instructions   Complete by: As directed    Patient will be discharged to home with home health services.  Patient will need to follow up with primary care provider within one week of discharge.  Follow up with neurology. Patient should continue medications as prescribed.  Patient should follow a heart healthy diet.     Allergies as of 08/10/2019   No Known Allergies     Medication List    STOP taking these medications   buPROPion 150 MG 24 hr tablet Commonly known as: WELLBUTRIN XL   polyethylene glycol-electrolytes 420 g solution Commonly known as: TriLyte   pravastatin 20 MG tablet Commonly known as: PRAVACHOL     TAKE these medications   aspirin 325 MG EC tablet Take 1 tablet (325 mg total) by mouth at bedtime. What changed:   medication strength  how much to take   atorvastatin 80 MG tablet Commonly known as: LIPITOR Take 1 tablet (80 mg total) by mouth daily. Start taking on: August 11, 2019   clopidogrel 75 MG tablet Commonly known as: PLAVIX Take 1 tablet (75 mg total) by mouth daily. Start taking on: August 11, 2019   Fish Oil 1000 MG Caps Take 1 capsule by mouth daily.   lisinopril 20 MG tablet Commonly known as: ZESTRIL Take 20 mg by mouth daily.   meclizine 25 MG tablet Commonly known as: ANTIVERT Take 1 tablet (25 mg total) by mouth 3 (three) times daily as needed for dizziness.   nicotine 14 mg/24hr patch Commonly known as: NICODERM CQ - dosed in mg/24 hours Place 1 patch (14 mg total) onto the skin daily. Start taking on: August 11, 2019   ondansetron 4 MG tablet Commonly known as: Zofran Take 1 tablet (4 mg total) by mouth daily as needed for nausea or vomiting.   TYLENOL 500 MG tablet Generic drug: acetaminophen Take 500 mg by mouth every 6 (six) hours as needed.      No Known Allergies  Follow-up Information    [facility]. Schedule an appointment as soon  as possible for a visit in 4 week(s).   Specialty: Neurology Contact information: [address] [place] [address] [phone]       Care, [facility] Follow up.   Specialty: Home Health Services Why: The home health agency will contact you for the first home visit.  Contact information: [address] [place] [address] [phone]        Assunta Found, MD. Schedule an appointment as soon as possible for a visit in 1 week(s).   Specialty: Family Medicine Why: Hospital follow up Contact information: [address] [place] [address] [phone]                The results of significant diagnostics from this hospitalization (including imaging, microbiology, ancillary and laboratory) are listed below for reference.    Significant Diagnostic Studies: CT Head Wo Contrast  Result Date: 08/06/2019 CLINICAL DATA:  Dizziness EXAM: CT HEAD WITHOUT CONTRAST TECHNIQUE: Contiguous axial images were obtained from the base of the skull through the vertex without intravenous contrast. COMPARISON:  None. FINDINGS: Brain: There is no acute intracranial hemorrhage, mass effect, or edema. Gray-white differentiation is preserved. There is no extra-axial fluid collection. Ventricles and sulci are within normal limits in size and configuration. Minimal patchy hypoattenuation in the supratentorial white matter is nonspecific but  may reflect minor chronic microvascular ischemic changes. Vascular: There is atherosclerotic calcification at the skull base. Skull: Calvarium is unremarkable. Sinuses/Orbits: No acute finding. Other: None. IMPRESSION: No acute intracranial abnormality. Electronically Signed   By: Guadlupe Spanish M.D.   On: 08/06/2019 14:26   MR ANGIO HEAD WO CONTRAST  Result Date: 08/06/2019 CLINICAL DATA:  Dizziness, nonspecific. EXAM: MRI HEAD WITHOUT CONTRAST MRA HEAD WITHOUT CONTRAST TECHNIQUE: Multiplanar, multiecho pulse  sequences of the brain and surrounding structures were obtained without intravenous contrast. Angiographic images of the head were obtained using MRA technique without contrast. COMPARISON:  Head CT 08/06/2019 FINDINGS: MRI HEAD FINDINGS Brain: Mild intermittent motion degradation. Cerebral volume is normal for age. There is a large region of restricted diffusion within the right cerebellum within the right PICA territory consistent with acute infarction. There is vague corresponding T2/FLAIR hyperintensity at this site. Mild scattered T2/FLAIR hyperintensity within the cerebral white matter is nonspecific, but consistent with chronic small vessel ischemic disease. There are small chronic thalamic lacunar infarcts bilaterally. A small chronic infarct is also present within the right cerebellum. No evidence of intracranial mass. No chronic intracranial blood products. No extra-axial fluid collection. No midline shift. Incidentally noted cavum septum pellucidum and cavum vergae. Vascular: Suspected right frontal lobe developmental venous anomaly. Otherwise reported below. Skull and upper cervical spine: No focal marrow lesion. Sinuses/Orbits: Visualized orbits show no acute finding. Mild ethmoid sinus mucosal thickening. Trace fluid within right mastoid air cells. MRA HEAD FINDINGS The intracranial internal carotid arteries are patent. The M1 middle cerebral arteries are patent without significant stenosis. No M2 proximal branch occlusion or high-grade proximal stenosis is identified. Multifocal mild-to-moderate atherosclerotic irregularity of M2 and more distal MCA branch vessels bilaterally. The anterior cerebral arteries are patent. No intracranial aneurysm is identified. No flow related signal is appreciated within the distal cervical right vertebral artery. The proximal V4 right vertebral artery is markedly diminutive and irregular in appearance suggestive of high-grade stenosis and/or partial occlusion. The  right PICA is poorly delineated and likely occluded. The intracranial left vertebral artery is patent without significant stenosis, as is the basilar artery. The posterior cerebral arteries are patent proximally without significant stenosis. These results were called by telephone at the time of interpretation on 08/06/2019 at 5:30 pm to provider Dr. Juleen China, Who verbally acknowledged these results. IMPRESSION: MRI brain: 1. Large acute infarct within the right cerebellar hemisphere within the right PICA territory. 2. Mild chronic small vessel ischemic changes within the cerebral white matter. 3. Chronic small-vessel infarcts within the thalami bilaterally and right cerebellum. MRA head: Absence of appreciable flow-related signal within the distal right cervical vertebral artery. Additionally, the proximal intracranial right vertebral artery is markedly irregular and diminutive in appearance suggestive of high-grade stenosis and/or partial occlusion. The right PICA is poorly delineated and likely occluded. CTA neck may be obtained for further characterization of these findings and to assess for possible right vertebral artery dissection. Electronically Signed   By: Jackey Loge DO   On: 08/06/2019 17:32   MR Angiogram Neck W or Wo Contrast  Result Date: 08/06/2019 CLINICAL DATA:  Dizziness and nausea. EXAM: MRA NECK WITHOUT AND WITH CONTRAST TECHNIQUE: Multiplanar and multiecho pulse sequences of the neck were obtained without and with intravenous contrast. Angiographic images of the neck were obtained using MRA technique without and with intravenous contrast. No fat-suppressed T1-weighted sequence was performed. CONTRAST:  27mL GADAVIST GADOBUTROL 1 MMOL/ML IV SOLN COMPARISON:  None. FINDINGS: Vertebral arteries: There is diffuse tapering of  the right vertebral artery along its entire length, with enhancement on postcontrast images. On time-of-flight images, there is no flow related enhancement of the right  vertebral artery. Left vertebral artery is normal on both sequences. Carotid arteries: There is atherosclerotic narrowing of the proximal right internal carotid artery measuring less than 50% by NASCET criteria. On the left there is atherosclerotic narrowing of the proximal internal carotid artery that measures approximately 60 % by NASCET criteria. IMPRESSION: 1. Narrowing of the right vertebral artery along its entire length on postcontrast imaging with no flow related enhancement on time-of-flight imaging. Acuity remains uncertain. 2. Bilateral internal carotid artery atherosclerosis measuring less than 50% on the right and 60% the left by NASCET criteria. Electronically Signed   By: Deatra Robinson M.D.   On: 08/06/2019 20:11   MR BRAIN WO CONTRAST  Result Date: 08/06/2019 CLINICAL DATA:  Dizziness, nonspecific. EXAM: MRI HEAD WITHOUT CONTRAST MRA HEAD WITHOUT CONTRAST TECHNIQUE: Multiplanar, multiecho pulse sequences of the brain and surrounding structures were obtained without intravenous contrast. Angiographic images of the head were obtained using MRA technique without contrast. COMPARISON:  Head CT 08/06/2019 FINDINGS: MRI HEAD FINDINGS Brain: Mild intermittent motion degradation. Cerebral volume is normal for age. There is a large region of restricted diffusion within the right cerebellum within the right PICA territory consistent with acute infarction. There is vague corresponding T2/FLAIR hyperintensity at this site. Mild scattered T2/FLAIR hyperintensity within the cerebral white matter is nonspecific, but consistent with chronic small vessel ischemic disease. There are small chronic thalamic lacunar infarcts bilaterally. A small chronic infarct is also present within the right cerebellum. No evidence of intracranial mass. No chronic intracranial blood products. No extra-axial fluid collection. No midline shift. Incidentally noted cavum septum pellucidum and cavum vergae. Vascular: Suspected right  frontal lobe developmental venous anomaly. Otherwise reported below. Skull and upper cervical spine: No focal marrow lesion. Sinuses/Orbits: Visualized orbits show no acute finding. Mild ethmoid sinus mucosal thickening. Trace fluid within right mastoid air cells. MRA HEAD FINDINGS The intracranial internal carotid arteries are patent. The M1 middle cerebral arteries are patent without significant stenosis. No M2 proximal branch occlusion or high-grade proximal stenosis is identified. Multifocal mild-to-moderate atherosclerotic irregularity of M2 and more distal MCA branch vessels bilaterally. The anterior cerebral arteries are patent. No intracranial aneurysm is identified. No flow related signal is appreciated within the distal cervical right vertebral artery. The proximal V4 right vertebral artery is markedly diminutive and irregular in appearance suggestive of high-grade stenosis and/or partial occlusion. The right PICA is poorly delineated and likely occluded. The intracranial left vertebral artery is patent without significant stenosis, as is the basilar artery. The posterior cerebral arteries are patent proximally without significant stenosis. These results were called by telephone at the time of interpretation on 08/06/2019 at 5:30 pm to provider Dr. Juleen China, Who verbally acknowledged these results. IMPRESSION: MRI brain: 1. Large acute infarct within the right cerebellar hemisphere within the right PICA territory. 2. Mild chronic small vessel ischemic changes within the cerebral white matter. 3. Chronic small-vessel infarcts within the thalami bilaterally and right cerebellum. MRA head: Absence of appreciable flow-related signal within the distal right cervical vertebral artery. Additionally, the proximal intracranial right vertebral artery is markedly irregular and diminutive in appearance suggestive of high-grade stenosis and/or partial occlusion. The right PICA is poorly delineated and likely occluded. CTA  neck may be obtained for further characterization of these findings and to assess for possible right vertebral artery dissection. Electronically Signed   By: Ronaldo Miyamoto  Golden DO   On: 08/06/2019 17:32   ECHOCARDIOGRAM COMPLETE  Result Date: 08/07/2019    ECHOCARDIOGRAM REPORT   Patient Name:   DAYSEAN TINKHAM Date of Exam: 08/07/2019 Medical Rec #:  [id number]      Height:       70.0 in Accession #:    [id number]     Weight:       214.0 lb Date of Birth:  01/15/1956     BSA:          2.148 m Patient Age:    63 years       BP:           178/92 mmHg Patient Gender: M              HR:           90 bpm. Exam Location:  Inpatient Procedure: 2D Echo Indications:    stroke 434.91  History:        Patient has prior history of Echocardiogram examinations, most                 recent 03/04/2017.  Sonographer:    Delcie Roch Referring Phys: [id number] DAWOOD S ELGERGAWY IMPRESSIONS  1. Left ventricular ejection fraction, by estimation, is 60 to 65%. The left ventricle has normal function. The left ventricle has no regional wall motion abnormalities. Left ventricular diastolic parameters were normal.  2. Right ventricular systolic function is normal. The right ventricular size is normal. Tricuspid regurgitation signal is inadequate for assessing PA pressure.  3. The mitral valve is normal in structure. Trivial mitral valve regurgitation.  4. The aortic valve has an indeterminant number of cusps. Aortic valve regurgitation is trivial. Mild aortic valve stenosis.  5. The inferior vena cava is normal in size with <50% respiratory variability, suggesting right atrial pressure of 8 mmHg. Comparison(s): No significant change from prior study. Conclusion(s)/Recommendation(s): No intracardiac source of embolism detected on this transthoracic study. A transesophageal echocardiogram is recommended to exclude cardiac source of embolism if clinically indicated. FINDINGS  Left Ventricle: Left ventricular ejection fraction, by estimation, is 60  to 65%. The left ventricle has normal function. The left ventricle has no regional wall motion abnormalities. The left ventricular internal cavity size was normal in size. There is  no left ventricular hypertrophy. Left ventricular diastolic parameters were normal. Right Ventricle: The right ventricular size is normal. No increase in right ventricular wall thickness. Right ventricular systolic function is normal. Tricuspid regurgitation signal is inadequate for assessing PA pressure. Left Atrium: Left atrial size was normal in size. Right Atrium: Right atrial size was normal in size. Pericardium: There is no evidence of pericardial effusion. Presence of pericardial fat pad. Mitral Valve: The mitral valve is normal in structure. Trivial mitral valve regurgitation. Tricuspid Valve: The tricuspid valve is normal in structure. Tricuspid valve regurgitation is trivial. Aortic Valve: The aortic valve has an indeterminant number of cusps. . There is moderate thickening and moderate calcification of the aortic valve. Aortic valve regurgitation is trivial. Mild aortic stenosis is present. There is moderate thickening of the aortic valve. There is moderate calcification of the aortic valve. Aortic valve mean gradient measures 12.0 mmHg. Aortic valve peak gradient measures 22.3 mmHg. Aortic valve area, by VTI measures 1.85 cm. Pulmonic Valve: The pulmonic valve was not well visualized. Pulmonic valve regurgitation is not visualized. Aorta: The aortic root, ascending aorta and aortic arch are all structurally normal, with no evidence of dilitation or obstruction. Venous: The  inferior vena cava is normal in size with less than 50% respiratory variability, suggesting right atrial pressure of 8 mmHg. IAS/Shunts: No atrial level shunt detected by color flow Doppler.  LEFT VENTRICLE PLAX 2D LVIDd:         4.80 cm  Diastology LVIDs:         3.20 cm  LV e' lateral:   9.68 cm/s LV PW:         1.00 cm  LV E/e' lateral: 7.7 LV IVS:         0.90 cm  LV e' medial:    8.81 cm/s LVOT diam:     2.00 cm  LV E/e' medial:  8.5 LV SV:         82 LV SV Index:   38 LVOT Area:     3.14 cm  RIGHT VENTRICLE             IVC RV S prime:     15.90 cm/s  IVC diam: 1.80 cm TAPSE (M-mode): 2.4 cm LEFT ATRIUM             Index       RIGHT ATRIUM           Index LA diam:        3.80 cm 1.77 cm/m  RA Area:     14.90 cm LA Vol (A2C):   43.0 ml 20.02 ml/m RA Volume:   37.10 ml  17.27 ml/m LA Vol (A4C):   41.3 ml 19.23 ml/m LA Biplane Vol: 43.4 ml 20.20 ml/m  AORTIC VALVE AV Area (Vmax):    1.67 cm AV Area (Vmean):   1.60 cm AV Area (VTI):     1.85 cm AV Vmax:           236.00 cm/s AV Vmean:          162.500 cm/s AV VTI:            0.445 m AV Peak Grad:      22.3 mmHg AV Mean Grad:      12.0 mmHg LVOT Vmax:         125.50 cm/s LVOT Vmean:        82.700 cm/s LVOT VTI:          0.262 m LVOT/AV VTI ratio: 0.59  AORTA Ao Root diam: 3.20 cm Ao Asc diam:  3.40 cm MITRAL VALVE MV Area (PHT): 2.87 cm    SHUNTS MV Decel Time: 264 msec    Systemic VTI:  0.26 m MV E velocity: 75.00 cm/s  Systemic Diam: 2.00 cm MV A velocity: 89.10 cm/s MV E/A ratio:  0.84 Jodelle Red MD Electronically signed by Jodelle Red MD Signature Date/Time: 08/07/2019/12:54:59 PM    Final     Microbiology: Recent Results (from the past 240 hour(s))  SARS Coronavirus 2 by RT PCR (hospital order, performed in [facility] hospital lab) Nasopharyngeal Nasopharyngeal Swab     Status: None   Collection Time: 08/06/19  7:29 PM   Specimen: Nasopharyngeal Swab  Result Value Ref Range Status   SARS Coronavirus 2 NEGATIVE NEGATIVE Final    Comment: (NOTE) SARS-CoV-2 target nucleic acids are NOT DETECTED.  The SARS-CoV-2 RNA is generally detectable in upper and lower respiratory specimens during the acute phase of infection. The lowest concentration of SARS-CoV-2 viral copies this assay can detect is 250 copies / mL. A negative result does not preclude SARS-CoV-2 infection and  should not be used as the sole basis for  treatment or other patient management decisions.  A negative result may occur with improper specimen collection / handling, submission of specimen other than nasopharyngeal swab, presence of viral mutation(s) within the areas targeted by this assay, and inadequate number of viral copies (<250 copies / mL). A negative result must be combined with clinical observations, patient history, and epidemiological information.  Fact Sheet for Patients:   BoilerBrush.com.cy  Fact Sheet for Healthcare Providers: https://pope.com/  This test is not yet approved or  cleared by the [place] FDA and has been authorized for detection and/or diagnosis of SARS-CoV-2 by FDA under an Emergency Use Authorization (EUA).  This EUA will remain in effect (meaning this test can be used) for the duration of the COVID-19 declaration under Section 564(b)(1) of the Act, 21 U.S.C. section 360bbb-3(b)(1), unless the authorization is terminated or revoked sooner.  Performed at [facility], [address], [place], [place] [address]      Labs: Basic Metabolic Panel: Recent Labs  Lab 08/06/19 1322  NA 138  K 3.8  CL 106  CO2 20*  GLUCOSE 145*  BUN 9  CREATININE 0.84  CALCIUM 8.9   Liver Function Tests: Recent Labs  Lab 08/06/19 1322  AST 23  ALT 24  ALKPHOS 52  BILITOT 0.4  PROT 7.6  ALBUMIN 4.3   No results for input(s): LIPASE, AMYLASE in the last 168 hours. No results for input(s): AMMONIA in the last 168 hours. CBC: Recent Labs  Lab 08/06/19 1322 08/08/19 0235  WBC 13.2* 13.2*  HGB 15.4 14.7  HCT 46.7 44.5  MCV 92.7 91.0  PLT 290 278   Cardiac Enzymes: No results for input(s): CKTOTAL, CKMB, CKMBINDEX, TROPONINI in the last 168 hours. BNP: BNP (last 3 results) No results for input(s): BNP in the last 8760 hours.  ProBNP (last 3 results) No results for input(s): PROBNP in the last 8760  hours.  CBG: No results for input(s): GLUCAP in the last 168 hours.     Signed:  Edsel Petrin  [facility] 08/10/2019, 3:51 PM

## 2019-08-10 NOTE — TOC Transition Note (Signed)
Transition of Care [facility]) - CM/SW Discharge Note   Patient Details  Name: Gerald Taylor MRN: [id number] Date of Birth: Mar 29, 1955  Transition of Care [facility]) CM/SW Contact:  Pollie Friar, RN Phone Number: 08/10/2019, 3:29 PM   Clinical Narrative:    Pt doesn't feel he can afford a CIR stay. CM met with the patient and his spouse and they are in agreement with [facility] services through the charity program. [facility] Lemmings is charity today and CM has provided pt's information to [place] with [place].  Pt and wife state they have 3 in 1 and walker at home.  Pt has PCP.  CM has asked MD to send his meds for d/c to [place] so CM can assist with the cost.  Wife can provide supervision at home and transportation to home.    Final next level of care: [place] Barriers to Discharge: Inadequate or no insurance, Barriers Unresolved (comment)   Patient Goals and CMS Choice   CMS Medicare.gov Compare Post Acute Care list provided to:: Patient Choice offered to / list presented to : Patient, Spouse  Discharge Placement                       Discharge Plan and Services                          HH Arranged: PT, OT [place] Agency: [facility] (charity home health) Date [place]: 08/10/19   Representative spoke with at [place]: [place] ([place]) Interventions     Readmission Risk Interventions No flowsheet data found.

## 2019-08-10 NOTE — Progress Notes (Signed)
Patient being discharged home.  Patient to be transported by his wife.  Discharge instructions given to the patient and his spouse who verbalized understanding.  Prescriptions to be delivered to the room.  IV removed with the catheter intact.

## 2019-08-10 NOTE — Plan of Care (Signed)
  Problem: Nutrition: Goal: Adequate nutrition will be maintained Outcome: Progressing   Problem: Coping: Goal: Level of anxiety will decrease Outcome: Progressing   Problem: Activity: Goal: Risk for activity intolerance will decrease Outcome: Progressing   

## 2019-08-10 NOTE — Plan of Care (Signed)
  Problem: Education: Goal: Knowledge of General Education information will improve Description: Including pain rating scale, medication(s)/side effects and non-pharmacologic comfort measures 08/10/2019 1442 by Dallas Breeding, RN Outcome: Adequate for Discharge 08/10/2019 0845 by [place] Breeding, RN Outcome: Progressing   Problem: Health Behavior/Discharge Planning: Goal: Ability to manage health-related needs will improve 08/10/2019 1442 by Dallas Breeding, RN Outcome: Adequate for Discharge 08/10/2019 0845 by Dallas Breeding, RN Outcome: Progressing

## 2019-08-10 NOTE — Progress Notes (Signed)
Inpatient Rehab Admissions Coordinator:  Called Christia Reading with financial counseling d/t pt request to talk to a financial counselor.  Pt is self pay.  Left message for Christia Reading.  Followed up with pt and his wife at bedside regarding cost of CIR.  Both indicated that the cost of CIR would be too expensive.  They would like pt to receive therapy services through another setting.  TOC notified.  I will sign off on this pt.   Wolfgang Phoenix, MS, CCC-SLP Admissions Coordinator [phone]

## 2019-09-14 ENCOUNTER — Encounter: Payer: Self-pay | Admitting: Adult Health

## 2019-09-14 ENCOUNTER — Other Ambulatory Visit: Payer: Self-pay

## 2019-09-14 ENCOUNTER — Ambulatory Visit: Payer: Self-pay | Admitting: Adult Health

## 2019-09-14 VITALS — BP 122/73 | HR 84 | Ht 69.0 in | Wt 191.0 lb

## 2019-09-14 DIAGNOSIS — F329 Major depressive disorder, single episode, unspecified: Secondary | ICD-10-CM

## 2019-09-14 DIAGNOSIS — I639 Cerebral infarction, unspecified: Secondary | ICD-10-CM

## 2019-09-14 DIAGNOSIS — F32A Depression, unspecified: Secondary | ICD-10-CM

## 2019-09-14 DIAGNOSIS — E785 Hyperlipidemia, unspecified: Secondary | ICD-10-CM

## 2019-09-14 DIAGNOSIS — I6523 Occlusion and stenosis of bilateral carotid arteries: Secondary | ICD-10-CM

## 2019-09-14 DIAGNOSIS — I10 Essential (primary) hypertension: Secondary | ICD-10-CM

## 2019-09-14 DIAGNOSIS — F419 Anxiety disorder, unspecified: Secondary | ICD-10-CM

## 2019-09-14 MED ORDER — QUETIAPINE FUMARATE 25 MG PO TABS
25.0000 mg | ORAL_TABLET | Freq: Every day | ORAL | 3 refills | Status: AC
Start: 2019-09-14 — End: ?

## 2019-09-14 MED ORDER — QUETIAPINE FUMARATE 25 MG PO TABS: 25.0000 mg | ORAL_TABLET | Freq: Every day | ORAL | 3 refills | Status: DC

## 2019-09-14 NOTE — Patient Instructions (Addendum)
Recommend initiating Seroquel 25 mg nightly for increased anxiety post stroke.  This medication will be used on short-term basis to allow time for escitalopram to take effect.  Please ensure you follow-up with your PCP in 2 weeks for possible need of escitalopram dosage adjustment or change in medication such as sertraline or fluoxetine  Continue aspirin 325 mg daily and clopidogrel 75 mg daily  and atorvastatin 80 mg daily for secondary stroke prevention  Continue aspirin for additional 2 months (3 month total duration) and then discontinue and remain on Plavix alone  Continue to follow up with PCP regarding cholesterol and blood pressure management  Maintain strict control of hypertension with blood pressure goal below 130/90 and cholesterol with LDL cholesterol (bad cholesterol) goal below 70 mg/dL.       Followup in the future with me in 3 months or call earlier if needed       Thank you for coming to see [place] at [facility]. I hope we have been able to provide you high quality care today.  You may receive a patient satisfaction survey over the next few weeks. We would appreciate your feedback and comments so that we may continue to improve ourselves and the health of our patients.     Emotional Health After Stroke Your emotional health may change after a stroke. You may have fear, anxiety, anger, sadness, and other feelings. Some of these changes happen because a stroke can damage your brain and nervous system. You may also have these feelings because coping with a change in your health can feel overwhelming. Depression and other emotional changes can slow your recovery after a stroke. It is important to recognize the symptoms so that you can take steps to strengthen your emotional health. What are some common emotions after a stroke? You may have:  Fear.  Anxiety.  Anger.  Frustration.  Sadness.  Feelings of loss or grief.  Depression.  Crying or  laughing at the wrong time or wrong situation (pseudobulbar affect, orPBA). What are the symptoms of depression? Depression after a stroke can happen right away, or it can show up later. Symptoms of depression may include:  Sleep problems.  Changes in normal eating habits, such as eating too much or too little.  Weight gain or weight loss.  Not having energy or enthusiasm (lethargy).  Feeling very tired (fatigue).  Avoiding people and activities (social withdrawal).  Irritability.  Crying more than usual.  Mood swings.  Not being able to concentrate.  Feeling hopeless.  Hating yourself.  Having suicidal thoughts. If you ever feel like you may hurt yourself or others, or have thoughts about taking your own life, get help right away. You can go to your nearest emergency department or call:  Your local emergency services (911 in the [place]  A suicide crisis helpline, such as the National Suicide Prevention Lifeline at (518)191-4827. This is open 24 hours a day. What increases my risk of depression? Having a stroke raises the risk of depression. The risk also goes up if you:  Are socially isolated.  Have a family history of depression.  Have a history of depression or other mental health problems before the stroke.  Are unable to work or do activities that you previously enjoyed.  Need help from others for daily activities.  Use drugs or drink alcohol.  Take certain medicines, such as sleeping pills or high blood pressure medicines. What are some coping methods I can use? Ask your health care  provider for help. Your health care provider may recommend treatments for depression, such as:  Talk therapy or counseling with a mental health professional. This may include cognitive behavioral therapy (CBT) to help change your patterns of thinking.  Medical therapies, such as brain stimulation or light therapy.  Lifestyle changes, such as eating a healthy diet and  avoiding alcohol.  Antidepressant medicines.  Alternative therapies, such as acupuncture, music therapy, or pet therapy. Other coping strategies you may try include:  Physical therapy or exercises. Try to do some exercise every day.  Writing your thoughts in a journal. An example might be keeping track of unrealistic thoughts or keeping a log of things you are thankful for.  Practicing good sleep habits, such as getting up at the same time every day.  Following a predictable routine each day.  Participating in activities that make you laugh.  Mindfulness therapy. This may include meditation and other techniques to lower your stress.  Joining a support group for people who are recovering from a stroke. These groups provide social interaction and help you feel connected to others. Your health care team can help you find a support group in your area. Summary  Your emotional health may change after a stroke. You may have fear, anxiety, anger, sadness, and other feelings.  It is important to recognize the symptoms of depression and other emotional problems.  If you experience emotional changes, let your loved ones know and contact your health care provider. This information is not intended to replace advice given to you by your health care provider. Make sure you discuss any questions you have with your health care provider. Document Revised: 12/14/2016 Document Reviewed: 04/06/2016 Elsevier Patient Education  2020 [employer].  Eating Plan After Stroke A stroke causes damage to the brain cells, which can affect your ability to walk, talk, and even eat. The impact of a stroke is different for everyone, and so is recovery. A good nutrition plan is important for your recovery. It can also lower your risk of another stroke. If you have difficulty chewing and swallowing your food, a dietitian or your stroke care team can help so that you can enjoy eating healthy foods. What are tips for  following this plan?  Reading food labels  Choose foods that have less than 300 milligrams (mg) of sodium per serving. Limit your sodium intake to less than 1,500 mg per day.  Avoid foods that have saturated fat and trans fat.  Choose foods that are low in cholesterol. Limit the amount of cholesterol you eat each day to less than 200 mg.  Choose foods that are high in fiber. Eat 20-30 grams (g) of fiber each day.  Avoid foods with added sugar. Check the food label for ingredients such as sugar, corn syrup, honey, fructose, molasses, and cane juice. Shopping  At the grocery store, buy most of your food from areas near the walls of the store. This includes: ? Fresh fruits and vegetables. ? Dry grains, beans, nuts, and seeds. ? Fresh seafood, poultry, lean meats, and eggs. ? Low-fat dairy products.  Buy whole ingredients instead of prepackaged foods.  Buy fresh, in-season fruits and vegetables from local farmers markets.  Buy frozen fruits and vegetables in resealable bags. Cooking  Prepare foods with very little salt. Use herbs or salt-free spices instead.  Cook with heart-healthy oils, such as olive, avocado, canola, soybean, or sunflower oil.  Avoid frying foods. Bake, grill, or broil foods instead.  Remove visible fat  and skin from meat and poultry before eating.  Modify food textures as told by your health care provider. Meal planning  Eat a wide variety of colorful fruits and vegetables. Make sure one-half of your plate is filled with fruits and vegetables at each meal.  Eat fruits and vegetables that are high in potassium, such as: ? Apples, bananas, oranges, and melon. ? Sweet potatoes, spinach, zucchini, and tomatoes.  Eat fish that contain heart-healthy fats (omega-3 fats) at least twice a week. These include salmon, tuna, mackerel, and sardines.  Eat plant foods that are high in omega-3 fats, such as flaxseeds and walnuts. Add these to cereals, yogurt, or pasta  dishes.  Eat several servings of high-fiber foods each day, such as fruits, vegetables, whole grains, and beans.  Do not put salt at the table for meals.  When eating out at restaurants: ? Ask the server about low-salt or salt-free food options. ? Avoid fried foods. Look for menu items that are grilled, steamed, broiled, or roasted. ? Ask if your food can be prepared without butter. ? Ask for condiments, such as salad dressings, gravy, or sauces to be served on the side.  If you have difficulty swallowing: ? Choose foods that are softer and easier to chew and swallow. ? Cut foods into small pieces and chew well before swallowing. ? Thicken liquids as told by your health care provider or dietitian. ? Let your health care provider know if your condition does not improve over time. You may need to work with a speech therapist to re-train the muscles that are used for eating. General recommendations  Involve your family and friends in your recovery, if possible. It may be helpful to have a slower meal time and to plan meals that include foods everyone in the family can eat.  Brush your teeth with fluoride toothpaste twice a day, and floss once a day. Keeping a clean mouth can help you swallow and can also help your appetite.  Drink enough water each day to keep your urine pale yellow. If needed, set reminders or ask your family to help you remember to drink water.  Limit alcohol intake to no more than 1 drink a day for nonpregnant women and 2 drinks a day for men. One drink equals 12 oz of beer, 5 oz of wine, or 1 oz of hard liquor. Summary  Following this eating plan can help in your stroke recovery and can decrease your risk for another stroke.  Let your health care provider know if you have problems with swallowing. You may need to work with a speech therapist. This information is not intended to replace advice given to you by your health care provider. Make sure you discuss any  questions you have with your health care provider. Document Revised: 04/24/2018 Document Reviewed: 03/11/2017 Elsevier Patient Education  2020 [employer].

## 2019-09-14 NOTE — Progress Notes (Signed)
[facility] [address] [place]. [place] [address] [phone]       HOSPITAL FOLLOW UP NOTE  Gerald. Gerald Taylor Date of Birth:  10/12/1955 Medical Record Number:  [id number]   Reason for Referral:  hospital stroke follow up    SUBJECTIVE:   CHIEF COMPLAINT:  Chief Complaint  Patient presents with  . Follow-up    rm 5  . Cerebrovascular Accident    Mild gait impairment; concern for increased anxiety post stroke    HPI:   Gerald Taylor is a 64 y.o. male with history of HTN, tobacco use who presented on 08/06/2019 with dizziness upon awakening.  Stroke work-up revealed moderate R cerebellar infarct in setting of R VA and PICA occlusion, likely secondary to large vessel disease source.  Recommended DAPT for 3 months followed by Plavix alone as previously on aspirin. Hx of HTN stable on high and long-term BP goal normotensive range.  Hx of HLD on pravastatin and fish oil with LDL 170 therefore switched to atorvastatin 80 mg daily.  No evidence or history of DM with A1c 5.7.  Current tobacco use with smoking cessation counseling provided.  Other stroke risk factors include prior stroke with imaging EtOH use, baseline memory deficits, obesity and THC use (+ UDS).  Residual deficits of nystagmus on bilateral gaze bidirectional with down beating component with left gaze and right-sided dysmetria and cognitive impairment.  Evaluated by therapy who recommended discharge to CIR but due to financial reasons and lack of insurance, he was discharged home with [facility] services.   Stroke:   moderate R cerebellar infarct in setting of R VA and PICA occlusion, likely secondary to large vessel disease source  CT head No acute abnormality.   MRI  moderate R cerebellar infarct. Old B thalamic and R cerebellar infarcts.  MRA head  Distal R cervical VA slow flow. R VA irregular and small, likely high-grade stenosis. R PICA likely occluded.   MRA neck R VA narrowing w/ no flow  seen. R ICA 50%, L ICA 50% stenoses.  2D Echo EF 60-65%. No source of embolus   LDL - 170  HgbA1c - 5.7  UDS + THC  VTE prophylaxis - Heparin 5000 units sq tid   aspirin 81 mg daily prior to admission, now on aspirin 325 mg daily and clopidogrel 75 mg daily following load. Continue DAPT x 3 months then plavix alone.    Therapy recommendations:  CIR --> HH PT/OT/SLP  Disposition: home  Today, 09/14/2019, Gerald Taylor is being seen for hospital follow-up accompanied by his wife  Residual deficits of gait impairment, worsening baseline memory loss and poststroke anxiety Reports improvement of gait and has since completed home health therapy.  Continues ambulate with RW for long distance but does not typically use in the home.  He was not using RW prior to stroke. Greatest concern today is in regards to poststroke anxiety where he has difficulty sleeping at night due to fear of having an additional stroke while he is sleeping.  He also reports increased agitation and irritation.  No prior history of depression or anxiety.  PCP started him on escitalopram 10 mg daily approximately 2 weeks ago but has not noticed any benefit.  Wife and patient request something to help calm him, help with insomnia and decrease his anxiety In regards to short-term memory impairment, history of memory impairment prior to the stroke with some worsening post stroke.  Is able to maintain majority of ADLs  independently.  Denies new or worsening stroke/TIA symptoms  Remains on DAPT with aspirin 325 mg daily and clopidogrel 75 mg daily without bleeding or bruising Continues on atorvastatin 80 mg daily without myalgias Blood pressure today 122/73 Continues to follow-up with a PCP for HTN and HLD management  No further concerns at this time     ROS:   14 system review of systems performed and negative with exception of anxiety, short-term memory loss and gait impairment  PMH:  Past Medical History:  Diagnosis  Date  . Anemia    hx of anemia as a baby  . Chronic kidney disease    bladder stone   . GERD (gastroesophageal reflux disease)    hx of   . Hypertension   . Shortness of breath    with exertion     PSH:  Past Surgical History:  Procedure Laterality Date  . APPENDECTOMY    . HEMORRHOID SURGERY      Social History:  Social History   Socioeconomic History  . Marital status: Married    Spouse name: Not on file  . Number of children: Not on file  . Years of education: Not on file  . Highest education level: Not on file  Occupational History  . Not on file  Tobacco Use  . Smoking status: Current Every Day Smoker    Packs/day: 1.00    Years: 38.00    Pack years: 38.00    Types: Cigarettes  . Smokeless tobacco: Former Clinical biochemist  . Vaping Use: Never used  Substance and Sexual Activity  . Alcohol use: Yes    Comment: occasional beer  . Drug use: No  . Sexual activity: Not on file  Other Topics Concern  . Not on file  Social History Narrative  . Not on file   Social Determinants of Health   Financial Resource Strain:   . Difficulty of Paying Living Expenses: Not on file  Food Insecurity:   . Worried About Programme researcher, broadcasting/film/video in the Last Year: Not on file  . Ran Out of Food in the Last Year: Not on file  Transportation Needs:   . Lack of Transportation (Medical): Not on file  . Lack of Transportation (Non-Medical): Not on file  Physical Activity:   . Days of Exercise per Week: Not on file  . Minutes of Exercise per Session: Not on file  Stress:   . Feeling of Stress : Not on file  Social Connections:   . Frequency of Communication with Friends and Family: Not on file  . Frequency of Social Gatherings with Friends and Family: Not on file  . Attends Religious Services: Not on file  . Active Member of Clubs or Organizations: Not on file  . Attends Banker Meetings: Not on file  . Marital Status: Not on file  Intimate Partner Violence:   .  Fear of Current or Ex-Partner: Not on file  . Emotionally Abused: Not on file  . Physically Abused: Not on file  . Sexually Abused: Not on file    Family History:  Family History  Problem Relation Age of Onset  . CAD Mother   . CAD Father     Medications:   Current Outpatient Medications on File Prior to Visit  Medication Sig Dispense Refill  . acetaminophen (TYLENOL) 500 MG tablet Take 500 mg by mouth every 6 (six) hours as needed.    Marland Kitchen aspirin EC 325 MG EC tablet Take  1 tablet (325 mg total) by mouth at bedtime. 30 tablet 3  . atorvastatin (LIPITOR) 80 MG tablet Take 1 tablet (80 mg total) by mouth daily. 30 tablet 0  . clopidogrel (PLAVIX) 75 MG tablet Take 1 tablet (75 mg total) by mouth daily. 30 tablet 2  . escitalopram (LEXAPRO) 10 MG tablet Take 10 mg by mouth daily.    Marland Kitchen. lisinopril (ZESTRIL) 20 MG tablet Take 20 mg by mouth daily.    . meclizine (ANTIVERT) 25 MG tablet Take 1 tablet (25 mg total) by mouth 3 (three) times daily as needed for dizziness. 30 tablet 0  . Omega-3 Fatty Acids (FISH OIL) 1000 MG CAPS Take 1 capsule by mouth daily.    . ondansetron (ZOFRAN) 4 MG tablet Take 1 tablet (4 mg total) by mouth daily as needed for nausea or vomiting. 30 tablet 1   No current facility-administered medications on file prior to visit.    Allergies:  No Known Allergies    OBJECTIVE:  Physical Exam  Vitals:   09/14/19 1410  BP: 122/73  Pulse: 84  Weight: 191 lb (86.6 kg)  Height: 5\' 9"  (1.753 m)   Body mass index is 28.21 kg/m. No exam data present  Depression screen [facility] 2/[facility] 09/14/2019  Decreased Interest 0  Down, Depressed, Hopeless 3  PHQ - 2 Score 3  Altered sleeping 3  Tired, decreased energy 3  Change in appetite 3  Feeling bad or failure about yourself  1  Trouble concentrating 3  Moving slowly or fidgety/restless 1  Suicidal thoughts 0  PHQ-9 Score 17    GAD 7 : Generalized Anxiety Score 09/14/2019  Nervous, Anxious, on Edge 3  Control/stop  worrying 3  Worry too much - different things 3  Trouble relaxing 3  Restless 3  Easily annoyed or irritable 1  Afraid - awful might happen 3  Total GAD 7 Score 19  Anxiety Difficulty Somewhat difficult   General: well developed, well nourished,  pleasantly anxious middle-aged Caucasian male, seated, in no evident distress Head: head normocephalic and atraumatic.   Neck: supple with no carotid or supraclavicular bruits Cardiovascular: regular rate and rhythm, no murmurs Musculoskeletal: no deformity Skin:  no rash/petichiae Vascular:  Normal pulses all extremities   Neurologic Exam Mental Status: Awake and fully alert.  Fluent speech and language. Oriented to place and time. Recent and remote memory intact. Attention span, concentration and fund of knowledge appropriate. Mood and affect mildly anxious but appropriate.  Cranial Nerves: Fundoscopic exam reveals sharp disc margins. Pupils equal, briskly reactive to light. Extraocular movements full without nystagmus. Visual fields full to confrontation. Hearing intact. Facial sensation intact. Face, tongue, palate moves normally and symmetrically.  Motor: Normal bulk and tone. Normal strength in all tested extremity muscles. Sensory.: intact to touch , pinprick , position and vibratory sensation.  Coordination: Rapid alternating movements normal in all extremities. Finger-to-nose and heel-to-shin performed accurately bilaterally. Gait and Station: Arises from chair without difficulty. Stance is normal. Gait demonstrates  mildly impaired gait with decreased step length and use of rolling walker. Reflexes: 1+ and symmetric. Toes downgoing.     NIHSS  0 Modified Rankin  2     ASSESSMENT: Gerald Taylor is a 64 y.o. year old male presented with dizziness upon awakening on 08/06/2019 with stroke work-up revealing moderate R cerebellar infarct in setting of R VA and PICA occlusion, likely secondary to large vessel disease source. Vascular  risk factors include HTN, HLD, intracranial stenosis, bilateral  carotid 50% stenosis, tobacco use and THC use.      PLAN:  1. R cerebellar stroke:  a. Residual deficit: Gait impairment and poststroke anxiety.  i. Gait impairment: Completed home health therapy with ongoing improvement.  Use of RW for long distance but not required in the home.  Encouraged continued HEP for likely ongoing improvement ii. Post stroke anxiety/depression: Denies personal or family history of anxiety or depression.  PHQ-9 17 and GAD 7 19. PCP started Lexapro approximately 2 weeks ago.  Due to severe anxiety/depression, recommend short course of Seroquel 25 mg nightly.  Discussed potential side effects with patient and wife wishing to proceed.  Advised to contact PCP for possible need of Lexapro adjustment or changing to different antidepressant if indicated over the next 2 weeks. b. Continue aspirin 325 mg daily and clopidogrel 75 mg daily  and atorvastatin 80 mg daily for secondary stroke prevention.  Continue DAPT for additional 2 months and then Plavix alone for total of 26-month duration. c. Close PCP follow up for aggressive stroke risk factor management  2. HTN: BP goal <130/90.  Stable.  Continue f/u with PCP 3. HLD: LDL goal <70. Recent LDL 170.  Continue atorvastatin 80 mg daily.  F/u with PCP for management as well as prescribing of statin 4. Bilateral carotid stenosis: Obtain carotid Dopplers 6 months post prior imaging for surveillance monitoring     Follow up in 3 months or call earlier if needed   I spent 50 minutes of face-to-face and non-face-to-face time with patient and wife.  This included previsit chart review, lab review, study review, order entry, electronic health record documentation, patient education regarding recent stroke and etiology, residual deficits as well as on discussion regarding post stroke anxiety/depression, importance of managing stroke risk factors and answered all questions  to patient and wifes satisfaction     Gerald Taylor, AGNP-BC  [facility] [address] [place], [place] [address]  Phone [phone] Fax [phone] Note: This document was prepared with digital dictation and possible smart phrase technology. Any transcriptional errors that result from this process are unintentional.

## 2019-09-15 NOTE — Progress Notes (Signed)
I agree with the above plan 

## 2019-12-28 ENCOUNTER — Ambulatory Visit: Payer: Self-pay | Admitting: Adult Health

## 2021-10-19 DIAGNOSIS — L918 Other hypertrophic disorders of the skin: Secondary | ICD-10-CM | POA: Diagnosis not present

## 2021-10-19 DIAGNOSIS — E7849 Other hyperlipidemia: Secondary | ICD-10-CM | POA: Diagnosis not present

## 2021-10-19 DIAGNOSIS — I639 Cerebral infarction, unspecified: Secondary | ICD-10-CM | POA: Diagnosis not present

## 2021-10-19 DIAGNOSIS — Z0001 Encounter for general adult medical examination with abnormal findings: Secondary | ICD-10-CM | POA: Diagnosis not present

## 2021-10-19 DIAGNOSIS — I10 Essential (primary) hypertension: Secondary | ICD-10-CM | POA: Diagnosis not present

## 2021-10-19 DIAGNOSIS — R7309 Other abnormal glucose: Secondary | ICD-10-CM | POA: Diagnosis not present

## 2021-10-19 DIAGNOSIS — E782 Mixed hyperlipidemia: Secondary | ICD-10-CM | POA: Diagnosis not present

## 2022-03-30 DIAGNOSIS — G629 Polyneuropathy, unspecified: Secondary | ICD-10-CM | POA: Diagnosis not present

## 2022-03-30 DIAGNOSIS — E782 Mixed hyperlipidemia: Secondary | ICD-10-CM | POA: Diagnosis not present

## 2022-03-30 DIAGNOSIS — I639 Cerebral infarction, unspecified: Secondary | ICD-10-CM | POA: Diagnosis not present

## 2022-03-30 DIAGNOSIS — I10 Essential (primary) hypertension: Secondary | ICD-10-CM | POA: Diagnosis not present

## 2022-03-30 DIAGNOSIS — E7849 Other hyperlipidemia: Secondary | ICD-10-CM | POA: Diagnosis not present

## 2022-03-30 DIAGNOSIS — R7309 Other abnormal glucose: Secondary | ICD-10-CM | POA: Diagnosis not present

## 2022-12-03 DIAGNOSIS — Z0001 Encounter for general adult medical examination with abnormal findings: Secondary | ICD-10-CM | POA: Diagnosis not present

## 2022-12-03 DIAGNOSIS — R7309 Other abnormal glucose: Secondary | ICD-10-CM | POA: Diagnosis not present

## 2022-12-03 DIAGNOSIS — I6350 Cerebral infarction due to unspecified occlusion or stenosis of unspecified cerebral artery: Secondary | ICD-10-CM | POA: Diagnosis not present

## 2022-12-03 DIAGNOSIS — G2581 Restless legs syndrome: Secondary | ICD-10-CM | POA: Diagnosis not present

## 2022-12-03 DIAGNOSIS — G629 Polyneuropathy, unspecified: Secondary | ICD-10-CM | POA: Diagnosis not present

## 2022-12-03 DIAGNOSIS — E782 Mixed hyperlipidemia: Secondary | ICD-10-CM | POA: Diagnosis not present

## 2022-12-03 DIAGNOSIS — I10 Essential (primary) hypertension: Secondary | ICD-10-CM | POA: Diagnosis not present

## 2022-12-03 DIAGNOSIS — D649 Anemia, unspecified: Secondary | ICD-10-CM | POA: Diagnosis not present

## 2022-12-03 DIAGNOSIS — E538 Deficiency of other specified B group vitamins: Secondary | ICD-10-CM | POA: Diagnosis not present

## 2022-12-20 DIAGNOSIS — D508 Other iron deficiency anemias: Secondary | ICD-10-CM | POA: Diagnosis not present

## 2022-12-20 DIAGNOSIS — E538 Deficiency of other specified B group vitamins: Secondary | ICD-10-CM | POA: Diagnosis not present

## 2022-12-21 ENCOUNTER — Encounter: Payer: Self-pay | Admitting: Internal Medicine

## 2022-12-27 ENCOUNTER — Encounter: Payer: Self-pay | Admitting: Family Medicine

## 2023-08-09 DIAGNOSIS — L400 Psoriasis vulgaris: Secondary | ICD-10-CM | POA: Diagnosis not present
